# Patient Record
Sex: Female | Born: 1990 | Race: White | Hispanic: No | Smoking: Former smoker
Health system: Southern US, Community
[De-identification: ages and names within clinical notes are randomized; demographics above are authoritative.]

## PROBLEM LIST (undated history)

## (undated) DIAGNOSIS — F32A Depression, unspecified: Secondary | ICD-10-CM

## (undated) DIAGNOSIS — F419 Anxiety disorder, unspecified: Secondary | ICD-10-CM

## (undated) DIAGNOSIS — F988 Other specified behavioral and emotional disorders with onset usually occurring in childhood and adolescence: Secondary | ICD-10-CM

## (undated) HISTORY — PX: FRACTURE SURGERY: SHX138

## (undated) HISTORY — DX: Depression, unspecified: F32.A

## (undated) HISTORY — PX: TYMPANOSTOMY TUBE PLACEMENT: SHX32

## (undated) HISTORY — DX: Anxiety disorder, unspecified: F41.9

## (undated) HISTORY — PX: TONSILLECTOMY: SUR1361

## (undated) HISTORY — DX: Other specified behavioral and emotional disorders with onset usually occurring in childhood and adolescence: F98.8

---

## 2019-05-07 ENCOUNTER — Ambulatory Visit: Payer: Self-pay

## 2019-06-05 ENCOUNTER — Other Ambulatory Visit: Payer: Self-pay

## 2019-06-05 DIAGNOSIS — Z20822 Contact with and (suspected) exposure to covid-19: Secondary | ICD-10-CM

## 2019-06-06 LAB — NOVEL CORONAVIRUS, NAA: SARS-CoV-2, NAA: NOT DETECTED

## 2019-11-19 ENCOUNTER — Other Ambulatory Visit: Payer: Self-pay

## 2019-11-19 ENCOUNTER — Ambulatory Visit: Payer: Self-pay | Admitting: Physician Assistant

## 2019-11-19 DIAGNOSIS — A5901 Trichomonal vulvovaginitis: Secondary | ICD-10-CM

## 2019-11-19 DIAGNOSIS — Z113 Encounter for screening for infections with a predominantly sexual mode of transmission: Secondary | ICD-10-CM

## 2019-11-19 LAB — WET PREP FOR TRICH, YEAST, CLUE
Trichomonas Exam: POSITIVE — AB
Yeast Exam: NEGATIVE

## 2019-11-19 MED ORDER — METRONIDAZOLE 500 MG PO TABS: 2000.0000 mg | ORAL_TABLET | Freq: Once | ORAL | 0 refills | Status: AC

## 2019-11-19 NOTE — Progress Notes (Signed)
Wet mount reviewed by provider, pt treated for Trich only per provider order. Provider orders completed. °

## 2019-11-20 ENCOUNTER — Encounter: Payer: Self-pay | Admitting: Physician Assistant

## 2019-11-20 NOTE — Progress Notes (Signed)
[facility] Department STI clinic/screening visit  Subjective:  Rebecca Bates is a 29 y.o. female being seen today for an STI screening visit. The patient reports they do have symptoms.  Patient reports that they do not desire a pregnancy in the next year.   They reported they are not interested in discussing contraception today.  No LMP recorded.   Patient has the following medical conditions:  There are no problems to display for this patient.   Chief Complaint  Patient presents with  . SEXUALLY TRANSMITTED DISEASE    STD screening including bloodwork    HPI  Patient reports that she has noticed an increased amount of vaginal discharge that has been clear.  States that she has an IUD and does not have periods with it in place.  Taking meds for Anxiety, Depression and ADD.  See flowsheet for further details and programmatic requirements.    The following portions of the patient's history were reviewed and updated as appropriate: allergies, current medications, past medical history, past social history, past surgical history and problem list.  Objective:  There were no vitals filed for this visit.  Physical Exam Constitutional:      General: She is not in acute distress.    Appearance: Normal appearance. She is obese.  HENT:     Head: Normocephalic and atraumatic.     Comments: No nits, lice, or hair loss. No cervical, supraclavicular or axillary adenopathy.    Mouth/Throat:     Mouth: Mucous membranes are moist.     Pharynx: Oropharynx is clear. No oropharyngeal exudate or posterior oropharyngeal erythema.  Eyes:     Conjunctiva/sclera: Conjunctivae normal.  Pulmonary:     Effort: Pulmonary effort is normal.  Abdominal:     Palpations: Abdomen is soft. There is no mass.     Tenderness: There is no abdominal tenderness. There is no guarding or rebound.  Genitourinary:    General: Normal vulva.     Rectum: Normal.     Comments: External genitalia/pubic  area without nits, lice, edema, erythema, lesions and inguinal adenopathy. Vagina with normal mucosa and moderate amount of thin, yellowish discharge, pH=>4.5. Cervix without visible lesions , IUD strings visualized. Uterus firm, mobile, nt, no masses, no CMT, no adnexal tenderness or fullness. Musculoskeletal:     Cervical back: Neck supple. No tenderness.  Skin:    General: Skin is warm and dry.     Findings: No bruising, erythema, lesion or rash.  Neurological:     Mental Status: She is alert and oriented to person, place, and time.  Psychiatric:        Mood and Affect: Mood normal.        Behavior: Behavior normal.        Thought Content: Thought content normal.        Judgment: Judgment normal.      Assessment and Plan:  Rebecca Bates is a 29 y.o. female presenting to the [facility] Department for STI screening  1. Screening for STD (sexually transmitted disease) Patient into clinic with symptoms. Rec condoms with all sex. Await test results.  Counseled that RN will call if needs to RTC for further treatment once results are back.  - WET PREP FOR [place], YEAST, CLUE - Chlamydia/Gonorrhea [facility] - HIV [facility] - Syphilis Serology, [facility] - Gonococcus culture  2. Trichomonal vaginitis Will treat Trich with Metronidazole 2 g po with food, no EtOH for 24 hr before and  until 72 hr after taking medicine. No sex for 7 days and until after partner completes treatment. RTC for re-treatment if vomits < 2 hr after taking medicine. - metroNIDAZOLE (FLAGYL) 500 MG tablet; Take 4 tablets (2,000 mg total) by mouth once for 1 dose.  Dispense: 4 tablet; Refill: 0     No follow-ups on file.  No future appointments.  Matt Holmes, PA

## 2019-11-24 LAB — GONOCOCCUS CULTURE

## 2020-01-06 ENCOUNTER — Other Ambulatory Visit: Payer: Self-pay

## 2020-01-06 ENCOUNTER — Emergency Department (HOSPITAL_COMMUNITY): Payer: 59

## 2020-01-06 ENCOUNTER — Emergency Department (HOSPITAL_COMMUNITY)
Admission: EM | Admit: 2020-01-06 | Discharge: 2020-01-06 | Disposition: A | Discharge status: Home / Self Care | Payer: 59 | Attending: Emergency Medicine | Admitting: Emergency Medicine

## 2020-01-06 ENCOUNTER — Encounter (HOSPITAL_COMMUNITY): Payer: Self-pay | Admitting: Emergency Medicine

## 2020-01-06 DIAGNOSIS — T07XXXA Unspecified multiple injuries, initial encounter: Secondary | ICD-10-CM | POA: Diagnosis present

## 2020-01-06 DIAGNOSIS — Y999 Unspecified external cause status: Secondary | ICD-10-CM | POA: Diagnosis not present

## 2020-01-06 DIAGNOSIS — Y929 Unspecified place or not applicable: Secondary | ICD-10-CM | POA: Insufficient documentation

## 2020-01-06 DIAGNOSIS — M545 Low back pain, unspecified: Secondary | ICD-10-CM

## 2020-01-06 DIAGNOSIS — Y939 Activity, unspecified: Secondary | ICD-10-CM | POA: Diagnosis not present

## 2020-01-06 DIAGNOSIS — S62337A Displaced fracture of neck of fifth metacarpal bone, left hand, initial encounter for closed fracture: Secondary | ICD-10-CM | POA: Diagnosis not present

## 2020-01-06 DIAGNOSIS — S50811A Abrasion of right forearm, initial encounter: Secondary | ICD-10-CM | POA: Insufficient documentation

## 2020-01-06 NOTE — Progress Notes (Signed)
Orthopedic Tech Progress Note Patient Details:  Rebecca Bates 03/07/1991 [id number]  Ortho Devices Type of Ortho Device: Ulna gutter splint Ortho Device/Splint Location: LUE Ortho Device/Splint Interventions: Ordered, Application   Post Interventions Patient Tolerated: Well Instructions Provided: Care of device   Donald Pore 01/06/2020, 12:21 PM
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
## 2020-01-06 NOTE — Discharge Instructions (Signed)
May take Tylenol and ibuprofen as needed for pain.  You do have a fracture in your hand.  You were placed in a splint.  You will need to follow-up with hand surgery outpatient.  His contact information is listed in your discharge paperwork.  You will need to call to schedule an appointment.  Return to the emergency department for any new or worsening symptoms

## 2020-03-10 LAB — HIV ANTIBODY (ROUTINE TESTING W REFLEX): HIV 1&2 Ab, 4th Generation: NEGATIVE

## 2020-04-22 ENCOUNTER — Other Ambulatory Visit: Payer: Self-pay

## 2020-04-22 ENCOUNTER — Ambulatory Visit
Admission: RE | Admit: 2020-04-22 | Discharge: 2020-04-22 | Disposition: A | Discharge status: Home / Self Care | Payer: 59 | Source: Ambulatory Visit | Attending: Emergency Medicine | Admitting: Emergency Medicine

## 2020-04-22 VITALS — BP 122/62 | HR 67 | Temp 98.0°F | Resp 18 | Ht 65.0 in | Wt 280.0 lb

## 2020-04-22 DIAGNOSIS — H6692 Otitis media, unspecified, left ear: Secondary | ICD-10-CM

## 2020-04-22 DIAGNOSIS — H6121 Impacted cerumen, right ear: Secondary | ICD-10-CM

## 2020-04-22 DIAGNOSIS — H9203 Otalgia, bilateral: Secondary | ICD-10-CM

## 2020-04-22 MED ORDER — AZITHROMYCIN 250 MG PO TABS: ORAL_TABLET | ORAL | 0 refills | Status: DC

## 2020-04-22 NOTE — ED Triage Notes (Signed)
Pt c/o bilateral ear pain. Start a couple of weeks ago but the right ear worsened about a week ago. She has h/o ear problems. She has trip OTC ear drops without relief.

## 2020-04-22 NOTE — ED Provider Notes (Signed)
[facility] ____________________________________________  Time seen: Approximately 12:23 PM  I have reviewed the triage vital signs and the nursing notes.   HISTORY  Chief Complaint Appointment and Otalgia   HPI Rebecca Bates is a 29 y.o. female presenting for evaluation of bilateral otalgia.  Patient reports is been going on for the last 2 weeks but has worsened in the last 1 week.  Left worse than right.  Has tried over-the-counter swimmer's ear drops without resolution.  Denies hearing changes, drainage, bleeding or trauma.  Denies accompanying cough, congestion, chest pain or shortness of breath, sore throat or fevers.  Continues eat and drink well.  Denies other aggravating or alleviating factors.    History reviewed. No pertinent past medical history.  There are no problems to display for this patient.   Past Surgical History:  Procedure Laterality Date   CESAREAN SECTION     x 2   FRACTURE SURGERY Left    elbow   TONSILLECTOMY     TYMPANOSTOMY TUBE PLACEMENT       No current facility-administered medications for this encounter.  Current Outpatient Medications:    amphetamine-dextroamphetamine (ADDERALL) 20 MG tablet, Take by mouth., Disp: , Rfl:    buPROPion (WELLBUTRIN SR) 200 MG 12 hr tablet, Take 200 mg by mouth daily., Disp: , Rfl:    levonorgestrel (MIRENA) 20 MCG/24HR IUD, by Intrauterine route., Disp: , Rfl:    sertraline (ZOLOFT) 100 MG tablet, Take 200 mg by mouth daily., Disp: , Rfl:    azithromycin (ZITHROMAX Z-PAK) 250 MG tablet, Take 2 tablets (500 mg) on  Day 1,  followed by 1 tablet (250 mg) once daily on Days 2 through 5., Disp: 6 each, Rfl: 0  Allergies Penicillins  Family History  Problem Relation Age of Onset   Healthy Mother    Healthy Father     Social History Social History   Tobacco Use   Smoking status: Never Smoker   Smokeless tobacco: Never Used  Building services engineer Use: Never used  Substance Use  Topics   Alcohol use: Yes    Comment: occasionally   Drug use: Not Currently    Types: Marijuana    Review of Systems Constitutional: No fever ENT: No sore throat. As above.  Cardiovascular: Denies chest pain. Respiratory: Denies shortness of breath. Gastrointestinal: No abdominal pain.  No nausea, no vomiting.  No diarrhea. Musculoskeletal: Negative for back pain. Skin: Negative for rash.   ____________________________________________   PHYSICAL EXAM:  VITAL SIGNS: ED Triage Vitals  Enc Vitals Group     BP 04/22/20 1127 (!) 122/62     Pulse Rate 04/22/20 1127 67     Resp 04/22/20 1127 18     Temp 04/22/20 1127 98 F (36.7 C)     Temp Source 04/22/20 1127 Oral     SpO2 04/22/20 1127 97 %     Weight 04/22/20 1123 (!) 280 lb (127 kg)     Height 04/22/20 1123 5\' 5"  (1.651 m)     Head Circumference --      Peak Flow --      Pain Score 04/22/20 1123 6     Pain Loc --      Pain Edu? --      Excl. in GC? --     Constitutional: Alert and oriented. Well appearing and in no acute distress. Eyes: Conjunctivae are normal. Head: Atraumatic. No sinus tenderness to palpation. No swelling. No erythema.  Ears: Left: No auricle tenderness,  canal mild cerumen, moderate erythema and dull TM.  Right: Nontender, canal with total cerumen impaction, unable to fully visualize TM, post irrigation and cerumen removal, cerumen persist without ability to visualize TM. Neck: No stridor.  No cervical spine tenderness to palpation. Hematological/Lymphatic/Immunilogical: No cervical lymphadenopathy. Respiratory: Normal respiratory effort.  Musculoskeletal: Ambulatory with steady gait.  Neurologic:  Normal speech and language. No gait instability. Skin:  Skin appears warm, dry and intact. No rash noted. Psychiatric: Mood and affect are normal. Speech and behavior are normal.  ___________________________________________   LABS (all labs ordered are listed, but only abnormal results are  displayed)  Labs Reviewed - No data to display  PROCEDURES Procedures   Cerumen impaction Right cerumen impaction, nursing staff attempted to irrigate multiple times with partial removal of impaction, but unable to fully remove impaction.  Also use curette without full ability to remove.  Patient tolerated well.  INITIAL IMPRESSION / ASSESSMENT AND PLAN / ED COURSE  Pertinent labs & imaging results that were available during my care of the patient were reviewed by me and considered in my medical decision making (see chart for details).  Well-appearing patient.  No acute distress.  Otalgia complaints.  Left otitis media.  Also cerumen impaction to the right, unable to fully clear, unclear if otitis media present as well.  Will treat with oral azithromycin as penicillin allergic.  Encourage supportive care.  Discussed in 2 weeks begin over-the-counter Debrox as long as she is feeling better to right ear to remove cerumen.Discussed indication, risks and benefits of medications with patient.   Discussed follow up with Primary care physician this week. Discussed follow up and return parameters including no resolution or any worsening concerns. Patient verbalized understanding and agreed to plan.   ____________________________________________   FINAL CLINICAL IMPRESSION(S) / ED DIAGNOSES  Final diagnoses:  Left otitis media, unspecified otitis media type  Otalgia of both ears  Impacted cerumen of right ear     ED Discharge Orders         Ordered    azithromycin (ZITHROMAX Z-PAK) 250 MG tablet     Discontinue  Reprint     04/22/20 1222           Note: This dictation was prepared with Dragon dictation along with smaller phrase technology. Any transcriptional errors that result from this process are unintentional.         Renford Dills, NP 04/22/20 1621

## 2020-04-22 NOTE — Discharge Instructions (Addendum)
Take medication as prescribed. Monitor.  °Follow up with your primary care physician this week as needed. Return to Urgent care for new or worsening concerns.  ° °

## 2020-06-15 ENCOUNTER — Other Ambulatory Visit: Payer: Self-pay

## 2020-06-15 ENCOUNTER — Ambulatory Visit
Admission: RE | Admit: 2020-06-15 | Discharge: 2020-06-15 | Disposition: A | Discharge status: Home / Self Care | Payer: 59 | Source: Ambulatory Visit | Attending: Emergency Medicine | Admitting: Emergency Medicine

## 2020-06-15 VITALS — BP 106/74 | HR 65 | Temp 99.1°F | Resp 19

## 2020-06-15 DIAGNOSIS — R05 Cough: Secondary | ICD-10-CM

## 2020-06-15 DIAGNOSIS — J069 Acute upper respiratory infection, unspecified: Secondary | ICD-10-CM | POA: Diagnosis not present

## 2020-06-15 NOTE — ED Provider Notes (Signed)
[name]    CSN: [id number] Arrival date & time: 06/15/20  1048      History   Chief Complaint Chief Complaint  Patient presents with  . Cough  . Generalized Body Aches    HPI Rebecca Bates is a 29 y.o. female.   Patient presents with fatigue, body aches, congestion, nonproductive cough, and diarrhea x3 days.  One episode of diarrhea today; 2 yesterday.  Patient denies vomiting and states she is able to keep yourself hydrated at home.  Treatment attempted at home with Tylenol and Benadryl.  She denies fever, chills, sore throat, shortness of breath, vomiting, rash, or other symptoms.  The history is provided by the patient.    History reviewed. No pertinent past medical history.  There are no problems to display for this patient.   Past Surgical History:  Procedure Laterality Date  . CESAREAN SECTION     x 2  . FRACTURE SURGERY Left    elbow  . TONSILLECTOMY    . TYMPANOSTOMY TUBE PLACEMENT      OB History   No obstetric history on file.      Home Medications    Prior to Admission medications   Medication Sig Start Date End Date Taking? Authorizing Provider  amphetamine-dextroamphetamine (ADDERALL) 20 MG tablet Take by mouth.    [provider]  azithromycin (ZITHROMAX Z-PAK) 250 MG tablet Take 2 tablets (500 mg) on  Day 1,  followed by 1 tablet (250 mg) once daily on Days 2 through 5. 04/22/20   Renford Dills, NP  buPROPion [facility] SR) 200 MG 12 hr tablet Take 200 mg by mouth daily. 03/30/20   [provider]  levonorgestrel (MIRENA) 20 MCG/24HR IUD by Intrauterine route.    [provider]  sertraline (ZOLOFT) 100 MG tablet Take 200 mg by mouth daily. 03/30/20   [provider]    Family History Family History  Problem Relation Age of Onset  . Healthy Mother   . Healthy Father     Social History Social History   Tobacco Use  . Smoking status: Never Smoker  . Smokeless tobacco: Never Used  Vaping  Use  . Vaping Use: Never used  Substance Use Topics  . Alcohol use: Yes    Comment: occasionally  . Drug use: Not Currently    Types: Marijuana     Allergies   Penicillins   Review of Systems Review of Systems  Constitutional: Positive for fatigue. Negative for chills and fever.  HENT: Positive for congestion. Negative for ear pain and sore throat.   Eyes: Negative for pain and visual disturbance.  Respiratory: Positive for cough. Negative for shortness of breath.   Cardiovascular: Negative for chest pain and palpitations.  Gastrointestinal: Positive for diarrhea. Negative for abdominal pain and vomiting.  Genitourinary: Negative for dysuria and hematuria.  Musculoskeletal: Negative for arthralgias and back pain.  Skin: Negative for color change and rash.  Neurological: Negative for seizures and syncope.  All other systems reviewed and are negative.    Physical Exam Triage Vital Signs ED Triage Vitals  Enc Vitals Group     BP      Pulse      Resp      Temp      Temp src      SpO2      Weight      Height      Head Circumference      Peak Flow  Pain Score      Pain Loc      Pain Edu?      Excl. in GC?    No data found.  Updated Vital Signs BP 106/74 (BP Location: Left Arm)   Pulse 65   Temp 99.1 F (37.3 C) (Oral)   Resp 19   SpO2 95%   Visual Acuity Right Eye Distance:   Left Eye Distance:   Bilateral Distance:    Right Eye Near:   Left Eye Near:    Bilateral Near:     Physical Exam Vitals and nursing note reviewed.  Constitutional:      General: She is not in acute distress.    Appearance: She is well-developed. She is obese.  HENT:     Head: Normocephalic and atraumatic.     Right Ear: Tympanic membrane normal.     Left Ear: Tympanic membrane normal.     Nose: Nose normal.     Mouth/Throat:     Mouth: Mucous membranes are moist.     Pharynx: Oropharynx is clear.  Eyes:     Conjunctiva/sclera: Conjunctivae normal.  Cardiovascular:      Rate and Rhythm: Normal rate and regular rhythm.     Heart sounds: No murmur heard.   Pulmonary:     Effort: Pulmonary effort is normal. No respiratory distress.     Breath sounds: Normal breath sounds.  Abdominal:     Palpations: Abdomen is soft.     Tenderness: There is no abdominal tenderness. There is no guarding or rebound.  Musculoskeletal:     Cervical back: Neck supple.  Skin:    General: Skin is warm and dry.     Findings: No rash.  Neurological:     General: No focal deficit present.     Mental Status: She is alert and oriented to person, place, and time.     Gait: Gait normal.  Psychiatric:        Mood and Affect: Mood normal.        Behavior: Behavior normal.      UC Treatments / Results  Labs (all labs ordered are listed, but only abnormal results are displayed) Labs Reviewed  NOVEL CORONAVIRUS, NAA    EKG   Radiology No results found.  Procedures Procedures (including critical care time)  Medications Ordered in UC Medications - No data to display  Initial Impression / Assessment and Plan / UC Course  I have reviewed the triage vital signs and the nursing notes.  Pertinent labs & imaging results that were available during my care of the patient were reviewed by me and considered in my medical decision making (see chart for details).   Viral URI with cough.  PCR COVID pending.  Instructed patient to self quarantine until the test result is back.  Discussed symptomatic treatment including Tylenol or ibuprofen, Mucinex, rest, hydration.  Instructed patient to go to the ED if she has acute worsening symptoms.  Patient agrees to plan of care.    Final Clinical Impressions(s) / UC Diagnoses   Final diagnoses:  Viral URI with cough     Discharge Instructions     Your COVID test is pending.  You should self quarantine until the test result is back.    Take Tylenol or ibuprofen as needed for fever or discomfort; Mucinex as needed for congestion   Rest and keep yourself hydrated.    Go to the emergency department if you develop acute worsening symptoms.  ED Prescriptions    None     PDMP not reviewed this encounter.   Mickie Bail, NP 06/15/20 1133

## 2020-06-15 NOTE — Discharge Instructions (Signed)
Your COVID test is pending.  You should self quarantine until the test result is back.    Take Tylenol or ibuprofen as needed for fever or discomfort; Mucinex as needed for congestion.  Rest and keep yourself hydrated.    Go to the emergency department if you develop acute worsening symptoms.     

## 2020-06-15 NOTE — ED Triage Notes (Signed)
Pt is here with body aches and a cough that started Friday, pt has taken Tylenol and Benadryl to relieve discomfort.

## 2020-06-17 LAB — SARS-COV-2, NAA 2 DAY TAT

## 2020-06-17 LAB — NOVEL CORONAVIRUS, NAA: SARS-CoV-2, NAA: NOT DETECTED

## 2021-02-03 ENCOUNTER — Ambulatory Visit: Payer: 59 | Admitting: Dermatology

## 2021-07-08 ENCOUNTER — Other Ambulatory Visit: Payer: Self-pay

## 2021-07-08 ENCOUNTER — Emergency Department: Payer: 59

## 2021-07-08 ENCOUNTER — Emergency Department
Admission: EM | Admit: 2021-07-08 | Discharge: 2021-07-09 | Disposition: A | Discharge status: Home / Self Care | Payer: 59 | Attending: Emergency Medicine | Admitting: Emergency Medicine

## 2021-07-08 DIAGNOSIS — Z20822 Contact with and (suspected) exposure to covid-19: Secondary | ICD-10-CM | POA: Diagnosis not present

## 2021-07-08 DIAGNOSIS — L509 Urticaria, unspecified: Secondary | ICD-10-CM | POA: Insufficient documentation

## 2021-07-08 DIAGNOSIS — R911 Solitary pulmonary nodule: Secondary | ICD-10-CM | POA: Insufficient documentation

## 2021-07-08 DIAGNOSIS — R079 Chest pain, unspecified: Secondary | ICD-10-CM

## 2021-07-08 DIAGNOSIS — R0789 Other chest pain: Secondary | ICD-10-CM | POA: Diagnosis not present

## 2021-07-08 LAB — TROPONIN I (HIGH SENSITIVITY)
Troponin I (High Sensitivity): <2 ng/L (ref <18)
Troponin I (High Sensitivity): 2 ng/L (ref <18)

## 2021-07-08 LAB — BASIC METABOLIC PANEL
Anion gap: 9 (ref 5–15)
BUN: 12 mg/dL (ref 6–20)
CO2: 24 mmol/L (ref 22–32)
Calcium: 9.2 mg/dL (ref 8.9–10.3)
Chloride: 103 mmol/L (ref 98–111)
Creatinine, Ser: 0.57 mg/dL (ref 0.44–1.00)
GFR, Estimated: >60 mL/min (ref >60)
Glucose, Bld: 110 mg/dL — ABNORMAL HIGH (ref 70–99)
Potassium: 3.6 mmol/L (ref 3.5–5.1)
Sodium: 136 mmol/L (ref 135–145)

## 2021-07-08 LAB — CBC
HCT: 39.5 % (ref 36.0–46.0)
Hemoglobin: 12.8 g/dL (ref 12.0–15.0)
MCH: 28.4 pg (ref 26.0–34.0)
MCHC: 32.4 g/dL (ref 30.0–36.0)
MCV: 87.8 fL (ref 80.0–100.0)
Platelets: 233 10*3/uL (ref 150–400)
RBC: 4.5 MIL/uL (ref 3.87–5.11)
RDW: 14 % (ref 11.5–15.5)
WBC: 10.5 10*3/uL (ref 4.0–10.5)
nRBC: 0 % (ref 0.0–0.2)

## 2021-07-08 MED ORDER — KETOROLAC TROMETHAMINE 30 MG/ML IJ SOLN
30.0000 mg | Freq: Once | INTRAMUSCULAR | Status: AC
  Administered 2021-07-09: 30 mg via INTRAVENOUS
  Filled 2021-07-08: qty 1

## 2021-07-08 MED ORDER — SODIUM CHLORIDE 0.9 % IV BOLUS
500.0000 mL | Freq: Once | INTRAVENOUS | Status: AC
  Administered 2021-07-09: 500 mL via INTRAVENOUS

## 2021-07-08 MED ORDER — METHYLPREDNISOLONE SODIUM SUCC 125 MG IJ SOLR
80.0000 mg | Freq: Once | INTRAMUSCULAR | Status: AC
Start: 2021-07-08 — End: 2021-07-09
  Administered 2021-07-09: 80 mg via INTRAVENOUS
  Filled 2021-07-08: qty 2

## 2021-07-08 MED ORDER — FAMOTIDINE IN NACL 20-0.9 MG/50ML-% IV SOLN
20.0000 mg | Freq: Once | INTRAVENOUS | Status: AC
  Administered 2021-07-09: 20 mg via INTRAVENOUS
  Filled 2021-07-08: qty 50

## 2021-07-08 NOTE — ED Triage Notes (Addendum)
Pt comes with c/o CP that started this afternoon. Pt states recently being sick with cold symptoms. But states this feels different.  Pt also states rash she noticed last week.

## 2021-07-08 NOTE — ED Provider Notes (Signed)
[facility] : [facility] Emergency Department Provider Note   ____________________________________________   Event Date/Time   First MD Initiated Contact with Patient 07/08/21 2329     (approximate)  I have reviewed the triage vital signs and the nursing notes.   HISTORY  Chief Complaint Chest Pain    HPI Rebecca Bates is a 30 y.o. female who presents to the ED from home with a chief complaint of chest pain.  Patient had cold-like symptoms last week including cough, congestion, body aches and headaches.  Reports sharp chest pain onset this afternoon, nonradiating and not associated with diaphoresis, palpitations, shortness of breath, nausea/vomiting or dizziness.  Noticed itchy diffuse rash x2 weeks which seems to be getting worse.  Denies any new foods, medications including over-the-counter's, environmental exposures.  Has IUD in place.  Denies recent tick bite, travel or trauma.     Past medical history None  There are no problems to display for this patient.   Past Surgical History:  Procedure Laterality Date   CESAREAN SECTION     x 2   FRACTURE SURGERY Left    elbow   TONSILLECTOMY     TYMPANOSTOMY TUBE PLACEMENT      Prior to Admission medications   Medication Sig Start Date End Date Taking? Authorizing Provider  famotidine (PEPCID) 20 MG tablet Take 1 tablet (20 mg total) by mouth 2 (two) times daily. 07/09/21  Yes Irean Hong, MD  predniSONE (DELTASONE) 20 MG tablet 3 tablets daily x 4 days 07/09/21  Yes Irean Hong, MD  amphetamine-dextroamphetamine (ADDERALL) 20 MG tablet Take by mouth.    [provider]  azithromycin (ZITHROMAX Z-PAK) 250 MG tablet Take 2 tablets (500 mg) on  Day 1,  followed by 1 tablet (250 mg) once daily on Days 2 through 5. 04/22/20   Renford Dills, NP  buPROPion [facility] SR) 200 MG 12 hr tablet Take 200 mg by mouth daily. 03/30/20   [provider]  levonorgestrel (MIRENA) 20 MCG/24HR IUD by  Intrauterine route.    [provider]  sertraline (ZOLOFT) 100 MG tablet Take 200 mg by mouth daily. 03/30/20   [provider]    Allergies Penicillins  Family History  Problem Relation Age of Onset   Healthy Mother    Healthy Father     Social History Social History   Tobacco Use   Smoking status: Never   Smokeless tobacco: Never  Vaping Use   Vaping Use: Never used  Substance Use Topics   Alcohol use: Yes    Comment: occasionally   Drug use: Not Currently    Types: Marijuana    Review of Systems  Constitutional: No fever/chills Eyes: No visual changes. ENT: No sore throat. Cardiovascular: Positive for chest pain. Respiratory: Denies shortness of breath. Gastrointestinal: No abdominal pain.  No nausea, no vomiting.  No diarrhea.  No constipation. Genitourinary: Negative for dysuria. Musculoskeletal: Negative for back pain. Skin: Positive for rash. Neurological: Negative for headaches, focal weakness or numbness.   ____________________________________________   PHYSICAL EXAM:  VITAL SIGNS: ED Triage Vitals [07/08/21 1839]  Enc Vitals Group     BP 134/79     Pulse Rate 79     Resp 18     Temp (!) 97.5 F (36.4 C)     Temp src      SpO2 100 %     Weight      Height      Head Circumference  Peak Flow      Pain Score      Pain Loc      Pain Edu?      Excl. in GC?     Constitutional: Alert and oriented. Well appearing and in no acute distress. Eyes: Conjunctivae are normal. PERRL. EOMI. Head: Atraumatic. Nose: No congestion/rhinnorhea. Mouth/Throat: Mucous membranes are mildly dry.   Neck: No stridor.   Cardiovascular: Normal rate, regular rhythm. Grossly normal heart sounds.  Good peripheral circulation. Respiratory: Normal respiratory effort.  No retractions. Lungs CTAB. Gastrointestinal: Soft and nontender to light or deep palpation. No distention. No abdominal bruits. No CVA tenderness. Musculoskeletal: No lower  extremity tenderness nor edema.  No joint effusions. Neurologic:  Normal speech and language. No gross focal neurologic deficits are appreciated. No gait instability. Skin:  Skin is warm, dry and intact.  Diffuse urticarial rash noted.  No vesicles or petechiae. Psychiatric: Mood and affect are normal. Speech and behavior are normal.  ____________________________________________   LABS (all labs ordered are listed, but only abnormal results are displayed)  Labs Reviewed  BASIC METABOLIC PANEL - Abnormal; Notable for the following components:      Result Value   Glucose, Bld 110 (*)    All other components within normal limits  D-DIMER, QUANTITATIVE - Abnormal; Notable for the following components:   D-Dimer, Quant 0.75 (*)    All other components within normal limits  POC URINE PREG, ED - Normal  RESP PANEL BY RT-PCR (FLU A&B, COVID) ARPGX2  CBC  POC SARS CORONAVIRUS 2 AG -  ED  TROPONIN I (HIGH SENSITIVITY)  TROPONIN I (HIGH SENSITIVITY)   ____________________________________________  EKG  ED ECG REPORT I, Tailynn Armetta J, the attending physician, personally viewed and interpreted this ECG.   Date: 07/08/2021  EKG Time: 2137  Rate: 60  Rhythm: normal sinus rhythm  Axis: Normal  Intervals:none  ST&T Change: Nonspecific T wave changes No prior EKG for comparison ____________________________________________  RADIOLOGY I, Ante Arredondo J, personally viewed and evaluated these images (plain radiographs) as part of my medical decision making, as well as reviewing the written report by the radiologist.  ED MD interpretation: No acute cardiopulmonary process; no PE, pulmonary nodule  Official radiology report(s): DG Chest 2 View  Result Date: 07/08/2021 CLINICAL DATA:  Chest pain EXAM: CHEST - 2 VIEW COMPARISON:  None. FINDINGS: Normal heart size. Normal mediastinal contour. No pneumothorax. No pleural effusion. Lungs appear clear, with no acute consolidative airspace disease and  no pulmonary edema. IMPRESSION: No active cardiopulmonary disease. Electronically Signed   By: Delbert Phenix M.D.   On: 07/08/2021 19:32   CT Angio Chest PE W/Cm &/Or Wo Cm  Result Date: 07/09/2021 CLINICAL DATA:  Pulmonary embolus suspected with low to intermediate probability. Positive D-dimer. Chest pain this afternoon. EXAM: CT ANGIOGRAPHY CHEST WITH CONTRAST TECHNIQUE: Multidetector CT imaging of the chest was performed using the standard protocol during bolus administration of intravenous contrast. Multiplanar CT image reconstructions and MIPs were obtained to evaluate the vascular anatomy. CONTRAST:  OMNIPAQUE IOHEXOL 350 MG/ML SOLN COMPARISON:  Chest radiograph 07/08/2021 FINDINGS: Cardiovascular: Motion artifact limits examination. There is good opacification of the central and segmental pulmonary arteries. No focal filling defects. No evidence of significant pulmonary embolus. Normal caliber thoracic aorta. No aortic dissection. Great vessel origins are patent. Normal heart size. No pericardial effusions. Mediastinum/Nodes: Esophagus is decompressed. No significant lymphadenopathy in the chest. Thyroid gland is unremarkable. Lungs/Pleura: No airspace disease or consolidation in the lungs. Nodule in the  right upper lung posteriorly measuring 3 mm diameter. No pleural effusions. No pneumothorax. Upper Abdomen: No acute abnormalities demonstrated in the visualized upper abdomen. Musculoskeletal: No chest wall abnormality. No acute or significant osseous findings. Review of the MIP images confirms the above findings. IMPRESSION: 1. No evidence of significant pulmonary embolus. 2. No evidence of active pulmonary disease. 3. 3 mm nodule in the right upper lung. No follow-up needed if patient is low-risk. Non-contrast chest CT can be considered in 12 months if patient is high-risk. This recommendation follows the consensus statement: Guidelines for Management of Incidental Pulmonary Nodules Detected on  CT Images: From the Fleischner Society 2017; Radiology 2017; 284:228-243. Electronically Signed   By: Burman Nieves M.D.   On: 07/09/2021 02:30    ____________________________________________   PROCEDURES  Procedure(s) performed (including Critical Care):  .1-3 Lead EKG Interpretation Performed by: Irean Hong, MD Authorized by: Irean Hong, MD     Interpretation: normal     ECG rate:  60   ECG rate assessment: normal     Rhythm: sinus rhythm     Ectopy: none     Conduction: normal   Comments:     Patient placed on cardiac monitor to evaluate for arrhythmias   ____________________________________________   INITIAL IMPRESSION / ASSESSMENT AND PLAN / ED COURSE  As part of my medical decision making, I reviewed the following data within the electronic MEDICAL RECORD NUMBER Nursing notes reviewed and incorporated, Labs reviewed, EKG interpreted, Old chart reviewed, Radiograph reviewed, and Notes from prior ED visits     30 year old female presenting with chest pain after cold-like symptoms last week; also with rash. Differential diagnosis includes, but is not limited to, ACS, aortic dissection, pulmonary embolism, cardiac tamponade, pneumothorax, pneumonia, pericarditis, myocarditis, GI-related causes including esophagitis/gastritis, and musculoskeletal chest wall pain.     2 sets of troponin and EKG unremarkable.  Respiratory panel is pending.  Will check D-dimer.  Administer IV Toradol for chest pain.  Administer IV Solu-Medrol and Pepcid for rash.  Will reassess.  Clinical Course as of 07/09/21 0435  Fri Jul 09, 2021  6203 Patient feeling better.  Updated her on all test results.  Will refer her to pulmonary nodule clinic for follow-up.  Will prescribe 4 additional days of prednisone and Pepcid for rash and refer her to local allergy clinic for testing.  Strict return precautions given.  Patient verbalizes understanding and agrees with plan of care. [JS]    Clinical Course User  Index [JS] Irean Hong, MD     ____________________________________________   FINAL CLINICAL IMPRESSION(S) / ED DIAGNOSES  Final diagnoses:  Nonspecific chest pain  Urticaria  Pulmonary nodule     ED Discharge Orders          Ordered    predniSONE (DELTASONE) 20 MG tablet        07/09/21 0256    famotidine (PEPCID) 20 MG tablet  2 times daily        07/09/21 0256    AMB  Referral to Pulmonary Nodule Clinic        07/09/21 0256             Note:  This document was prepared using Dragon voice recognition software and may include unintentional dictation errors.    Irean Hong, MD 07/09/21 [phone]

## 2021-07-08 NOTE — ED Notes (Signed)
Reviewed pt's cc and results; pt taken to triage for re-assessment; no EKG noted in chart, will perform now with repeat vs and troponin

## 2021-07-09 ENCOUNTER — Encounter: Payer: Self-pay | Admitting: *Deleted

## 2021-07-09 ENCOUNTER — Emergency Department: Payer: 59

## 2021-07-09 DIAGNOSIS — R911 Solitary pulmonary nodule: Secondary | ICD-10-CM

## 2021-07-09 LAB — D-DIMER, QUANTITATIVE: D-Dimer, Quant: 0.75 ug/mL-FEU — ABNORMAL HIGH (ref 0.00–0.50)

## 2021-07-09 LAB — POC URINE PREG, ED: Preg Test, Ur: NEGATIVE

## 2021-07-09 LAB — RESP PANEL BY RT-PCR (FLU A&B, COVID) ARPGX2
Influenza A by PCR: NEGATIVE
Influenza B by PCR: NEGATIVE
SARS Coronavirus 2 by RT PCR: NEGATIVE

## 2021-07-09 MED ORDER — PREDNISONE 20 MG PO TABS: ORAL_TABLET | ORAL | 0 refills | Status: DC

## 2021-07-09 MED ORDER — FAMOTIDINE 20 MG PO TABS: 20.0000 mg | ORAL_TABLET | Freq: Two times a day (BID) | ORAL | 0 refills | Status: DC

## 2021-07-09 MED ORDER — IOHEXOL 350 MG/ML SOLN
100.0000 mL | Freq: Once | INTRAVENOUS | Status: AC | PRN
  Administered 2021-07-09: 100 mL via INTRAVENOUS

## 2021-07-09 NOTE — Discharge Instructions (Signed)
1.  Take the following medicines daily x4 days: Prednisone 60mg  daily Pepcid 20 mg twice daily 2.  You may take Benadryl as needed for itching. 3.  You have been referred to the pulmonary nodule clinic.  They will call you for an appointment. 4.  Return to the ER for worsening symptoms, persistent vomiting, difficulty breathing or other concerns.

## 2021-07-13 ENCOUNTER — Encounter: Payer: Self-pay | Admitting: Emergency Medicine

## 2021-07-13 ENCOUNTER — Ambulatory Visit
Admission: EM | Admit: 2021-07-13 | Discharge: 2021-07-13 | Disposition: A | Discharge status: Home / Self Care | Payer: 59 | Attending: Emergency Medicine | Admitting: Emergency Medicine

## 2021-07-13 DIAGNOSIS — M5441 Lumbago with sciatica, right side: Secondary | ICD-10-CM

## 2021-07-13 MED ORDER — IBUPROFEN 600 MG PO TABS: 600.0000 mg | ORAL_TABLET | Freq: Four times a day (QID) | ORAL | 0 refills | Status: AC | PRN

## 2021-07-13 MED ORDER — METHOCARBAMOL 500 MG PO TABS: 500.0000 mg | ORAL_TABLET | Freq: Two times a day (BID) | ORAL | 0 refills | Status: DC | PRN

## 2021-07-13 NOTE — ED Provider Notes (Signed)
[name]    CSN: [id number] Arrival date & time: 07/13/21  9470      History   Chief Complaint Chief Complaint  Patient presents with   Back Pain    HPI Rebecca Bates is a 30 y.o. female.  Patient presents with 2-day history of right lower back pain which radiates down her posterior right leg.  No falls or injury.  She denies numbness, weakness, saddle anesthesia, loss of bowel/bladder control, abdominal pain, dysuria, hematuria, or other symptoms.  Treatment at home with Aleve.  She reports history of sciatica during pregnancy several years ago.  The history is provided by the patient.   History reviewed. No pertinent past medical history.  There are no problems to display for this patient.   Past Surgical History:  Procedure Laterality Date   CESAREAN SECTION     x 2   FRACTURE SURGERY Left    elbow   TONSILLECTOMY     TYMPANOSTOMY TUBE PLACEMENT      OB History   No obstetric history on file.      Home Medications    Prior to Admission medications   Medication Sig Start Date End Date Taking? Authorizing Provider  ibuprofen (ADVIL) 600 MG tablet Take 1 tablet (600 mg total) by mouth every 6 (six) hours as needed. 07/13/21  Yes Mickie Bail, NP  methocarbamol (ROBAXIN) 500 MG tablet Take 1 tablet (500 mg total) by mouth 2 (two) times daily as needed for muscle spasms. 07/13/21  Yes Mickie Bail, NP  amphetamine-dextroamphetamine (ADDERALL) 20 MG tablet Take by mouth.    [provider]  azithromycin (ZITHROMAX Z-PAK) 250 MG tablet Take 2 tablets (500 mg) on  Day 1,  followed by 1 tablet (250 mg) once daily on Days 2 through 5. 04/22/20   Renford Dills, NP  buPROPion [facility] SR) 200 MG 12 hr tablet Take 200 mg by mouth daily. 03/30/20   [provider]  famotidine (PEPCID) 20 MG tablet Take 1 tablet (20 mg total) by mouth 2 (two) times daily. 07/09/21   Irean Hong, MD  levonorgestrel (MIRENA) 20 MCG/24HR IUD by Intrauterine  route.    [provider]  predniSONE (DELTASONE) 20 MG tablet 3 tablets daily x 4 days 07/09/21   Irean Hong, MD  sertraline (ZOLOFT) 100 MG tablet Take 200 mg by mouth daily. 03/30/20   [provider]    Family History Family History  Problem Relation Age of Onset   Healthy Mother    Healthy Father     Social History Social History   Tobacco Use   Smoking status: Never   Smokeless tobacco: Never  Vaping Use   Vaping Use: Never used  Substance Use Topics   Alcohol use: Yes    Comment: occasionally   Drug use: Not Currently    Types: Marijuana     Allergies   Penicillins   Review of Systems Review of Systems  Constitutional:  Negative for chills and fever.  Respiratory:  Negative for cough and shortness of breath.   Cardiovascular:  Negative for chest pain and palpitations.  Gastrointestinal:  Negative for abdominal pain and vomiting.  Genitourinary:  Negative for dysuria and hematuria.  Musculoskeletal:  Positive for back pain. Negative for arthralgias, gait problem and joint swelling.  Skin:  Negative for color change and rash.  Neurological:  Negative for weakness and numbness.  All other systems reviewed and are negative.   Physical Exam Triage Vital  Signs ED Triage Vitals  Enc Vitals Group     BP      Pulse      Resp      Temp      Temp src      SpO2      Weight      Height      Head Circumference      Peak Flow      Pain Score      Pain Loc      Pain Edu?      Excl. in GC?    No data found.  Updated Vital Signs BP 134/87 (BP Location: Left Arm)   Pulse 65   Temp 97.9 F (36.6 C)   Resp 18   SpO2 98%   Visual Acuity Right Eye Distance:   Left Eye Distance:   Bilateral Distance:    Right Eye Near:   Left Eye Near:    Bilateral Near:     Physical Exam Vitals and nursing note reviewed.  Constitutional:      General: She is not in acute distress.    Appearance: She is well-developed. She is obese. She is not  ill-appearing.  HENT:     Head: Normocephalic and atraumatic.     Mouth/Throat:     Mouth: Mucous membranes are moist.  Eyes:     Conjunctiva/sclera: Conjunctivae normal.  Cardiovascular:     Rate and Rhythm: Normal rate and regular rhythm.     Heart sounds: Normal heart sounds.  Pulmonary:     Effort: Pulmonary effort is normal. No respiratory distress.     Breath sounds: Normal breath sounds.  Abdominal:     General: Bowel sounds are normal.     Palpations: Abdomen is soft.     Tenderness: There is no abdominal tenderness. There is no right CVA tenderness, left CVA tenderness, guarding or rebound.  Musculoskeletal:        General: No swelling, tenderness, deformity or signs of injury. Normal range of motion.     Cervical back: Neck supple.       Back:  Skin:    General: Skin is warm and dry.     Capillary Refill: Capillary refill takes less than 2 seconds.     Findings: No bruising, erythema, lesion or rash.  Neurological:     General: No focal deficit present.     Mental Status: She is alert and oriented to person, place, and time.     Sensory: No sensory deficit.     Motor: No weakness.     Gait: Gait normal.  Psychiatric:        Mood and Affect: Mood normal.        Behavior: Behavior normal.     UC Treatments / Results  Labs (all labs ordered are listed, but only abnormal results are displayed) Labs Reviewed - No data to display  EKG   Radiology No results found.  Procedures Procedures (including critical care time)  Medications Ordered in UC Medications - No data to display  Initial Impression / Assessment and Plan / UC Course  I have reviewed the triage vital signs and the nursing notes.  Pertinent labs & imaging results that were available during my care of the patient were reviewed by me and considered in my medical decision making (see chart for details).  Right lower back pain with right-sided sciatica.  Treating with ibuprofen and methocarbamol.   Precautions for drowsiness with methocarbamol discussed.  Instructed patient to follow-up with her PCP or an orthopedist if her symptoms are not improving.  Education provided on sciatica.  Work note provided per patient request.  She agrees to plan of care.   Final Clinical Impressions(s) / UC Diagnoses   Final diagnoses:  Acute right-sided low back pain with right-sided sciatica     Discharge Instructions      Take ibuprofen as needed for discomfort.  Take the muscle relaxer as needed for muscle spasm; Do not drive, operate machinery, or drink alcohol with this medication as it can cause drowsiness.   Follow up with your primary care provider or an orthopedist if your symptoms are not improving.         ED Prescriptions     Medication Sig Dispense Auth. Provider   methocarbamol (ROBAXIN) 500 MG tablet Take 1 tablet (500 mg total) by mouth 2 (two) times daily as needed for muscle spasms. 10 tablet Mickie Bail, NP   ibuprofen (ADVIL) 600 MG tablet Take 1 tablet (600 mg total) by mouth every 6 (six) hours as needed. 30 tablet Mickie Bail, NP      I have reviewed the PDMP during this encounter.   Mickie Bail, NP 07/13/21 [phone]

## 2021-07-13 NOTE — Discharge Instructions (Addendum)
Take ibuprofen as needed for discomfort.  Take the muscle relaxer as needed for muscle spasm; Do not drive, operate machinery, or drink alcohol with this medication as it can cause drowsiness.   Follow up with your primary care provider or an orthopedist if your symptoms are not improving.     

## 2021-07-13 NOTE — ED Triage Notes (Signed)
Pt said hx of sciatic about 5 years with pregnancy but yesterday began having pain in her lower back that runs down her right side into her leg.

## 2021-07-17 ENCOUNTER — Emergency Department (HOSPITAL_COMMUNITY)
Admission: EM | Admit: 2021-07-17 | Discharge: 2021-07-17 | Disposition: A | Discharge status: Home / Self Care | Payer: 59 | Attending: Emergency Medicine | Admitting: Emergency Medicine

## 2021-07-17 DIAGNOSIS — M5441 Lumbago with sciatica, right side: Secondary | ICD-10-CM | POA: Diagnosis not present

## 2021-07-17 DIAGNOSIS — M545 Low back pain, unspecified: Secondary | ICD-10-CM | POA: Diagnosis present

## 2021-07-17 LAB — URINALYSIS, ROUTINE W REFLEX MICROSCOPIC
Bilirubin Urine: NEGATIVE
Glucose, UA: NEGATIVE mg/dL
Hgb urine dipstick: NEGATIVE
Ketones, ur: NEGATIVE mg/dL
Leukocytes,Ua: NEGATIVE
Nitrite: NEGATIVE
Protein, ur: NEGATIVE mg/dL
Specific Gravity, Urine: 1.038 — ABNORMAL HIGH (ref 1.005–1.030)
pH: 5 (ref 5.0–8.0)

## 2021-07-17 LAB — CBC WITH DIFFERENTIAL/PLATELET
Abs Immature Granulocytes: 0.05 10*3/uL (ref 0.00–0.07)
Basophils Absolute: 0 10*3/uL (ref 0.0–0.1)
Basophils Relative: 0 %
Eosinophils Absolute: 0 10*3/uL (ref 0.0–0.5)
Eosinophils Relative: 0 %
HCT: 45.4 % (ref 36.0–46.0)
Hemoglobin: 14 g/dL (ref 12.0–15.0)
Immature Granulocytes: 0 %
Lymphocytes Relative: 13 %
Lymphs Abs: 1.5 10*3/uL (ref 0.7–4.0)
MCH: 27.7 pg (ref 26.0–34.0)
MCHC: 30.8 g/dL (ref 30.0–36.0)
MCV: 89.9 fL (ref 80.0–100.0)
Monocytes Absolute: 0.4 10*3/uL (ref 0.1–1.0)
Monocytes Relative: 3 %
Neutro Abs: 9.6 10*3/uL — ABNORMAL HIGH (ref 1.7–7.7)
Neutrophils Relative %: 84 %
Platelets: 258 10*3/uL (ref 150–400)
RBC: 5.05 MIL/uL (ref 3.87–5.11)
RDW: 14 % (ref 11.5–15.5)
WBC: 11.6 10*3/uL — ABNORMAL HIGH (ref 4.0–10.5)
nRBC: 0 % (ref 0.0–0.2)

## 2021-07-17 LAB — BASIC METABOLIC PANEL
Anion gap: 8 (ref 5–15)
BUN: 23 mg/dL — ABNORMAL HIGH (ref 6–20)
CO2: 25 mmol/L (ref 22–32)
Calcium: 9 mg/dL (ref 8.9–10.3)
Chloride: 103 mmol/L (ref 98–111)
Creatinine, Ser: 0.69 mg/dL (ref 0.44–1.00)
GFR, Estimated: >60 mL/min (ref >60)
Glucose, Bld: 137 mg/dL — ABNORMAL HIGH (ref 70–99)
Potassium: 3.8 mmol/L (ref 3.5–5.1)
Sodium: 136 mmol/L (ref 135–145)

## 2021-07-17 MED ORDER — PREDNISONE 10 MG PO TABS: 20.0000 mg | ORAL_TABLET | Freq: Two times a day (BID) | ORAL | 0 refills | Status: DC

## 2021-07-17 MED ORDER — PREDNISONE 20 MG PO TABS
60.0000 mg | ORAL_TABLET | Freq: Once | ORAL | Status: AC
  Administered 2021-07-17: 60 mg via ORAL
  Filled 2021-07-17: qty 3

## 2021-07-17 MED ORDER — CYCLOBENZAPRINE HCL 10 MG PO TABS
10.0000 mg | ORAL_TABLET | Freq: Once | ORAL | Status: AC
  Administered 2021-07-17: 10 mg via ORAL
  Filled 2021-07-17: qty 1

## 2021-07-17 MED ORDER — CYCLOBENZAPRINE HCL 10 MG PO TABS: 10.0000 mg | ORAL_TABLET | Freq: Two times a day (BID) | ORAL | 0 refills | Status: DC | PRN

## 2021-07-17 MED ORDER — OXYCODONE-ACETAMINOPHEN 5-325 MG PO TABS
2.0000 | ORAL_TABLET | Freq: Once | ORAL | Status: AC
  Administered 2021-07-17: 2 via ORAL
  Filled 2021-07-17: qty 2

## 2021-07-17 MED ORDER — KETOROLAC TROMETHAMINE 60 MG/2ML IM SOLN
30.0000 mg | Freq: Once | INTRAMUSCULAR | Status: AC
  Administered 2021-07-17: 30 mg via INTRAMUSCULAR
  Filled 2021-07-17: qty 2

## 2021-07-17 MED ORDER — OXYCODONE-ACETAMINOPHEN 5-325 MG PO TABS: 1.0000 | ORAL_TABLET | Freq: Four times a day (QID) | ORAL | 0 refills | Status: DC | PRN
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
## 2021-07-17 NOTE — ED Provider Notes (Signed)
[facility] EMERGENCY DEPARTMENT Provider Note   CSN: [id number] Arrival date & time: 07/17/21  0818     History Chief Complaint  Patient presents with   Back Pain    Rebecca Bates is a 30 y.o. female.  HPI 30 year old female G2, P2 presents today complaining of low back pain with right leg numbness that occurred over the past 5 days.  During that time she has been seen at urgent care and at the orthopedic urgent care.  She has been on low-dose prednisone and skeletal muscle relaxants without relief.  Pain is severe at 9 out of 10.  She has no history of trauma, loss of bowel or bladder control or perineal sensation.  She does endorse that she has some numbness on the right side of the lower leg.  She has some increased pain with movement and walking.  Best position is standing.  She has no prior similar history.  She has no history of IV drug abuse or spinal manipulation.    No past medical history on file.  There are no problems to display for this patient.   Past Surgical History:  Procedure Laterality Date   CESAREAN SECTION     x 2   FRACTURE SURGERY Left    elbow   TONSILLECTOMY     TYMPANOSTOMY TUBE PLACEMENT       OB History   No obstetric history on file.     Family History  Problem Relation Age of Onset   Healthy Mother    Healthy Father     Social History   Tobacco Use   Smoking status: Never   Smokeless tobacco: Never  Vaping Use   Vaping Use: Never used  Substance Use Topics   Alcohol use: Yes    Comment: occasionally   Drug use: Not Currently    Types: Marijuana    Home Medications Prior to Admission medications   Medication Sig Start Date End Date Taking? Authorizing Provider  amphetamine-dextroamphetamine (ADDERALL) 20 MG tablet Take by mouth.    [provider]  azithromycin (ZITHROMAX Z-PAK) 250 MG tablet Take 2 tablets (500 mg) on  Day 1,  followed by 1 tablet (250 mg) once daily on Days 2 through 5.  04/22/20   Renford Dills, NP  buPROPion [facility] SR) 200 MG 12 hr tablet Take 200 mg by mouth daily. 03/30/20   [provider]  famotidine (PEPCID) 20 MG tablet Take 1 tablet (20 mg total) by mouth 2 (two) times daily. 07/09/21   Irean Hong, MD  ibuprofen (ADVIL) 600 MG tablet Take 1 tablet (600 mg total) by mouth every 6 (six) hours as needed. 07/13/21   Mickie Bail, NP  levonorgestrel (MIRENA) 20 MCG/24HR IUD by Intrauterine route.    [provider]  methocarbamol (ROBAXIN) 500 MG tablet Take 1 tablet (500 mg total) by mouth 2 (two) times daily as needed for muscle spasms. 07/13/21   Mickie Bail, NP  predniSONE (DELTASONE) 20 MG tablet 3 tablets daily x 4 days 07/09/21   Irean Hong, MD  sertraline (ZOLOFT) 100 MG tablet Take 200 mg by mouth daily. 03/30/20   [provider]    Allergies    Penicillins  Review of Systems   Review of Systems  All other systems reviewed and are negative.  Physical Exam Updated Vital Signs BP 115/75 (BP Location: Right Arm)   Pulse 77   Temp 97.6 F (36.4 C) (Oral)   Resp  20   SpO2 99%   Physical Exam Vitals and nursing note reviewed.  Constitutional:      General: She is not in acute distress.    Appearance: She is well-developed.  HENT:     Head: Normocephalic and atraumatic.     Right Ear: External ear normal.     Left Ear: External ear normal.     Nose: Nose normal.  Eyes:     Conjunctiva/sclera: Conjunctivae normal.     Pupils: Pupils are equal, round, and reactive to light.  Pulmonary:     Effort: Pulmonary effort is normal.  Musculoskeletal:        General: Normal range of motion.     Cervical back: Normal range of motion and neck supple.  Skin:    General: Skin is warm and dry.  Neurological:     General: No focal deficit present.     Mental Status: She is alert and oriented to person, place, and time.     Sensory: Sensory deficit present.     Motor: No weakness or abnormal muscle tone.      Coordination: Coordination normal.     Gait: Gait normal.     Deep Tendon Reflexes: Reflexes normal.     Comments: Decreased sensation right lower leg lateral aspect  Psychiatric:        Behavior: Behavior normal.        Thought Content: Thought content normal.    ED Results / Procedures / Treatments   Labs (all labs ordered are listed, but only abnormal results are displayed) Labs Reviewed  BASIC METABOLIC PANEL - Abnormal; Notable for the following components:      Result Value   Glucose, Bld 137 (*)    BUN 23 (*)    All other components within normal limits  URINALYSIS, ROUTINE W REFLEX MICROSCOPIC - Abnormal; Notable for the following components:   APPearance HAZY (*)    Specific Gravity, Urine 1.038 (*)    All other components within normal limits  CBC WITH DIFFERENTIAL/PLATELET - Abnormal; Notable for the following components:   WBC 11.6 (*)    Neutro Abs 9.6 (*)    All other components within normal limits  I-STAT CHEM 8, ED    EKG None  Radiology No results found.  Procedures Procedures   Medications Ordered in ED Medications  ketorolac (TORADOL) injection 30 mg (30 mg Intramuscular Given 07/17/21 1120)    ED Course  I have reviewed the triage vital signs and the nursing notes.  Pertinent labs & imaging results that were available during my care of the patient were reviewed by me and considered in my medical decision making (see chart for details).    MDM Rules/Calculators/A&P                           Patient with low back pain with radiculopathy.  She has no cauda equina symptoms or acute strength loss.  Plan referral to neurosurgery with increased dose of prednisone, Percocet, and will change skeletal muscle relaxant.  Discussed whether or not to obtain MRI at this point.  Given that she is not currently considering surgery, with no loss of strength no acute cauda equina's symptoms, will hold on that at this time.  She understands to return if she has  loss of bowel or bladder control, perineal sensation, or loss of strength we discussed return precautions and need for close follow-up and she voices understanding. Final  Clinical Impression(s) / ED Diagnoses Final diagnoses:  Acute right-sided low back pain with right-sided sciatica    Rx / DC Orders ED Discharge Orders     None        Margarita Grizzle, MD 07/17/21 1145

## 2021-07-17 NOTE — ED Triage Notes (Addendum)
Pt here d/t right side back pain. No trauma reported. Pt seen at both UC and [facility]. XRs taken. Tylenol, Robaxin and Prednisone prescribed without relief. Pt reports new onset of left foot and leg numbness. No loss of bladder control reported . 10/10 pain. Pt appears visible uncomfortable.

## 2021-07-17 NOTE — ED Notes (Signed)
Patient verbalizes understanding of d/c instructions. Opportunities for questions and answers were provided. Pt d/c from ED and ambulated to lobby with significant other. °

## 2021-07-18 LAB — I-STAT CHEM 8, ED
BUN: 26 mg/dL — ABNORMAL HIGH (ref 6–20)
Calcium, Ion: 1.22 mmol/L (ref 1.15–1.40)
Chloride: 103 mmol/L (ref 98–111)
Creatinine, Ser: 0.7 mg/dL (ref 0.44–1.00)
Glucose, Bld: 132 mg/dL — ABNORMAL HIGH (ref 70–99)
HCT: 45 % (ref 36.0–46.0)
Hemoglobin: 15.3 g/dL — ABNORMAL HIGH (ref 12.0–15.0)
Potassium: 3.8 mmol/L (ref 3.5–5.1)
Sodium: 140 mmol/L (ref 135–145)
TCO2: 27 mmol/L (ref 22–32)

## 2021-07-19 ENCOUNTER — Telehealth: Payer: Self-pay | Admitting: *Deleted

## 2021-07-19 NOTE — Telephone Encounter (Signed)
Referral received to follow up with patient for incidental lung nodule found on recent imaging. Attempted to contact pt to schedule further follow up appts based on recommendations. Pt did not answer and unable to leave message. Will mail letter to patient instructing pt to call back to schedule further follow up in the lung nodule clinic.

## 2021-07-28 ENCOUNTER — Other Ambulatory Visit: Payer: Self-pay | Admitting: Neurosurgery

## 2021-07-28 DIAGNOSIS — M5416 Radiculopathy, lumbar region: Secondary | ICD-10-CM

## 2021-08-21 ENCOUNTER — Ambulatory Visit
Admission: RE | Admit: 2021-08-21 | Discharge: 2021-08-21 | Disposition: A | Discharge status: Home / Self Care | Payer: 59 | Source: Ambulatory Visit | Attending: Neurosurgery | Admitting: Neurosurgery

## 2021-08-21 ENCOUNTER — Other Ambulatory Visit: Payer: Self-pay

## 2021-08-21 DIAGNOSIS — M5416 Radiculopathy, lumbar region: Secondary | ICD-10-CM

## 2022-06-16 ENCOUNTER — Emergency Department
Admission: EM | Admit: 2022-06-16 | Discharge: 2022-06-16 | Disposition: A | Discharge status: Home / Self Care | Payer: 59 | Attending: Emergency Medicine | Admitting: Emergency Medicine

## 2022-06-16 ENCOUNTER — Encounter: Payer: Self-pay | Admitting: Emergency Medicine

## 2022-06-16 ENCOUNTER — Other Ambulatory Visit: Payer: Self-pay

## 2022-06-16 ENCOUNTER — Emergency Department: Payer: 59

## 2022-06-16 DIAGNOSIS — W231XXA Caught, crushed, jammed, or pinched between stationary objects, initial encounter: Secondary | ICD-10-CM | POA: Diagnosis not present

## 2022-06-16 DIAGNOSIS — S6992XA Unspecified injury of left wrist, hand and finger(s), initial encounter: Secondary | ICD-10-CM | POA: Diagnosis present

## 2022-06-16 DIAGNOSIS — R202 Paresthesia of skin: Secondary | ICD-10-CM | POA: Insufficient documentation

## 2022-06-16 DIAGNOSIS — R200 Anesthesia of skin: Secondary | ICD-10-CM | POA: Insufficient documentation

## 2022-06-16 DIAGNOSIS — S60222A Contusion of left hand, initial encounter: Secondary | ICD-10-CM | POA: Insufficient documentation

## 2022-06-16 NOTE — ED Notes (Signed)
E signature pad not working. Pt educated on discharge instructions and verbalized understanding.  

## 2022-06-16 NOTE — ED Triage Notes (Signed)
Pt to ER with reports of left hand getting shut in car door.  Pt states door latched.  Minor abrasions noted to fifth digit.  Swelling noted to several fingers.  Pt reports some numbness to several fingers.

## 2022-06-16 NOTE — ED Provider Notes (Signed)
[facility] Provider Note    Event Date/Time   First MD Initiated Contact with Patient 06/16/22 1806     (approximate)   History   Chief Complaint Hand Injury   HPI  Rebecca Bates is a 31 y.o. female with no significant past medical history who presents to the ED complaining of hand injury.  Patient reports that just prior to arrival her left hand got closed in a car door.  She states that the door then latched shut and her hand was stuck for about 1 minute before they could open the door.  She states that it primarily closed on her left pinky finger, where she has noticed some pain and swelling along with difficulty bending the finger.  She reports some numbness and tingling in this finger along with her left ring finger, denies any pain in her palm area or wrist.     Physical Exam   Triage Vital Signs: ED Triage Vitals  Enc Vitals Group     BP 06/16/22 1733 135/85     Pulse Rate 06/16/22 1733 65     Resp 06/16/22 1733 18     Temp 06/16/22 1733 97.8 F (36.6 C)     Temp Source 06/16/22 1733 Oral     SpO2 06/16/22 1733 100 %     Weight 06/16/22 1744 252 lb (114.3 kg)     Height 06/16/22 1744 5\' 6"  (1.676 m)     Head Circumference --      Peak Flow --      Pain Score 06/16/22 1744 8     Pain Loc --      Pain Edu? --      Excl. in GC? --     Most recent vital signs: Vitals:   06/16/22 1733  BP: 135/85  Pulse: 65  Resp: 18  Temp: 97.8 F (36.6 C)  SpO2: 100%    Constitutional: Alert and oriented. Eyes: Conjunctivae are normal. Head: Atraumatic. Nose: No congestion/rhinnorhea. Mouth/Throat: Mucous membranes are moist.  Cardiovascular: Normal rate, regular rhythm. Grossly normal heart sounds.  2+ radial pulses bilaterally.  Cap refill less than 2 seconds throughout digits of left hand. Respiratory: Normal respiratory effort.  No retractions. Lungs CTAB. Gastrointestinal: Soft and nontender. No distention. Musculoskeletal: No lower  extremity tenderness nor edema.  Mild edema and tenderness to palpation diffusely at left pinky finger and ring finger.  Small abrasion to the ventral surface of left pinky finger with no active bleeding.  Patient able to flex and extend both fingers. Neurologic:  Normal speech and language. No gross focal neurologic deficits are appreciated.    ED Results / Procedures / Treatments   Labs (all labs ordered are listed, but only abnormal results are displayed) Labs Reviewed - No data to display  RADIOLOGY Left hand x-ray reviewed and interpreted by me with no fracture or dislocation.  PROCEDURES:  Critical Care performed: No  Procedures   MEDICATIONS ORDERED IN ED: Medications - No data to display   IMPRESSION / MDM / ASSESSMENT AND PLAN / ED COURSE  I reviewed the triage vital signs and the nursing notes.                              31 y.o. female with no significant past medical history who presents to the ED complaining of left hand pain and swelling after her pinky and ring fingers were closed in a  car door.  Patient's presentation is most consistent with acute complicated illness / injury requiring diagnostic workup.  Differential diagnosis includes, but is not limited to, fracture, dislocation, ligamentous injury, vascular injury.  Patient nontoxic-appearing and in no acute distress, vital signs are unremarkable.  She is neurovascularly intact throughout her left hand, able to flex and extend pinky and ring fingers with minimal difficulty.  No evidence to suggest fracture or dislocation on x-ray, no findings concerning for significant ligamentous injury.  Pinky finger was splinted and buddy taped to ring finger using Ace wrap.  Patient is appropriate for discharge home with PCP follow-up, was counseled to return to the ED for new or worsening symptoms, patient agrees with plan.      FINAL CLINICAL IMPRESSION(S) / ED DIAGNOSES   Final diagnoses:  Contusion of left  hand, initial encounter     Rx / DC Orders   ED Discharge Orders     None        Note:  This document was prepared using Dragon voice recognition software and may include unintentional dictation errors.   Blake Divine, MD 06/16/22 1919

## 2022-09-28 DIAGNOSIS — R5383 Other fatigue: Secondary | ICD-10-CM | POA: Diagnosis not present

## 2022-09-28 DIAGNOSIS — Z730 Burn-out: Secondary | ICD-10-CM | POA: Diagnosis not present

## 2022-09-28 DIAGNOSIS — G4700 Insomnia, unspecified: Secondary | ICD-10-CM | POA: Diagnosis not present

## 2022-09-28 DIAGNOSIS — F900 Attention-deficit hyperactivity disorder, predominantly inattentive type: Secondary | ICD-10-CM | POA: Diagnosis not present

## 2022-09-28 DIAGNOSIS — R69 Illness, unspecified: Secondary | ICD-10-CM | POA: Diagnosis not present

## 2022-09-28 DIAGNOSIS — F411 Generalized anxiety disorder: Secondary | ICD-10-CM | POA: Diagnosis not present

## 2022-09-29 IMAGING — MR MR LUMBAR SPINE W/O CM
4 of 9 series · 15 of 48 positions shown · non-contrast
Comparison: Radiograph from 01/06/2020.

CLINICAL DATA: Initial evaluation for low back pain with extension
into the right buttock and right lower extremity.

EXAM:
MRI LUMBAR SPINE WITHOUT CONTRAST
TECHNIQUE: Multiplanar, multisequence MR imaging of the lumbar spine was
performed. No intravenous contrast was administered.

[Series 5: T2 · sagittal · 4.0mm · 0.73mm/px · 4 of 15 slices shown (1 of 4)]
[im 1/15]
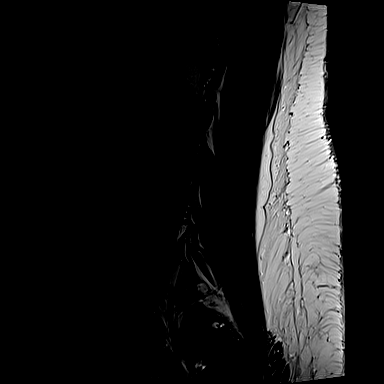
[im 5/15]
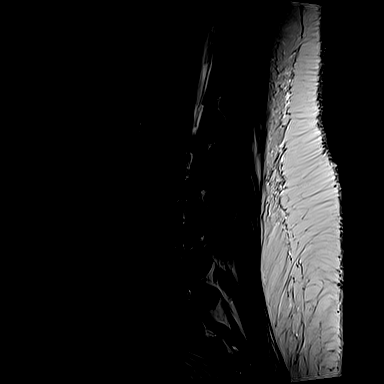
[im 10/15]
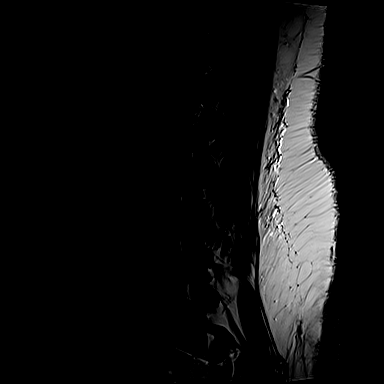
[im 15/15]
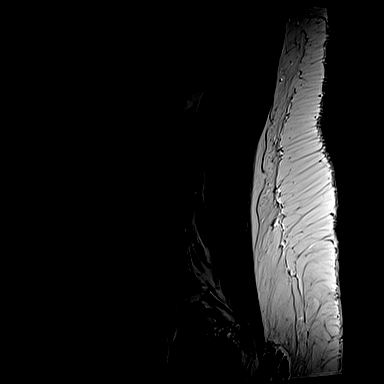

[Series 11: T2 · axial · 4.0mm · 0.28mm/px · z∈[+18,+118]mm · 5 of 21 slices shown (2 of 4)]
[im 1/21]
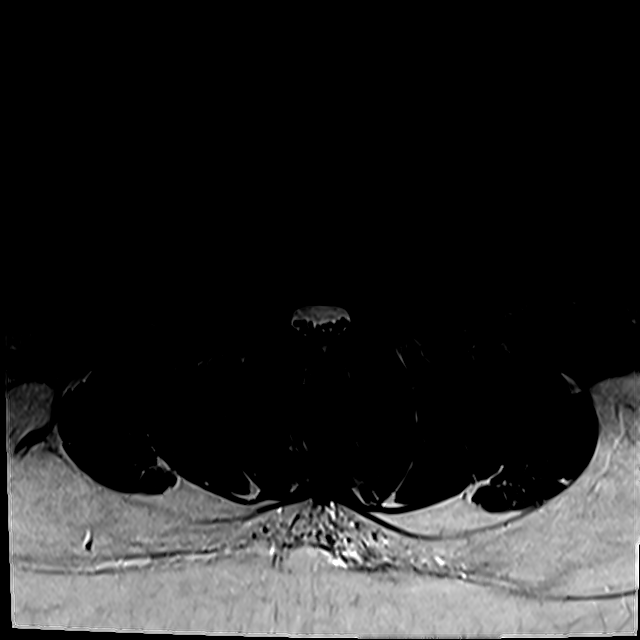
[im 6/21]
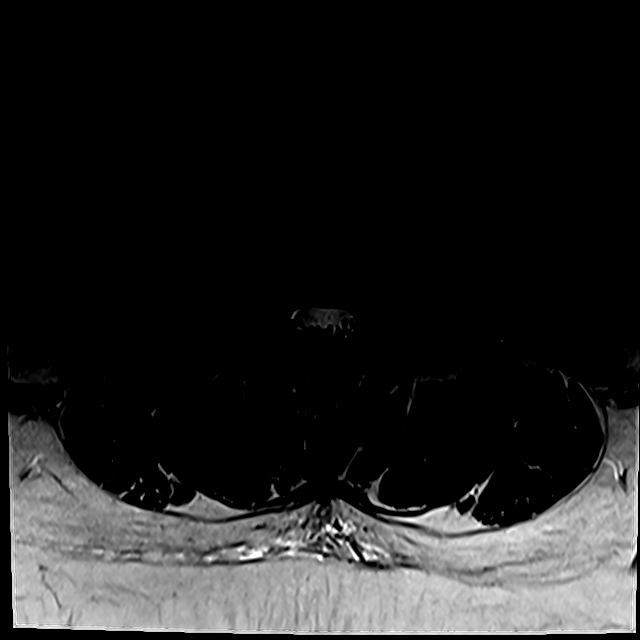
[im 11/21]
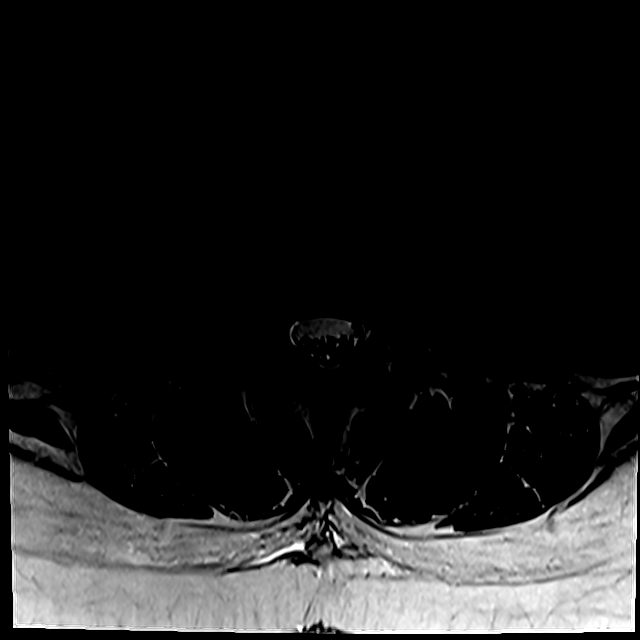
[im 16/21]
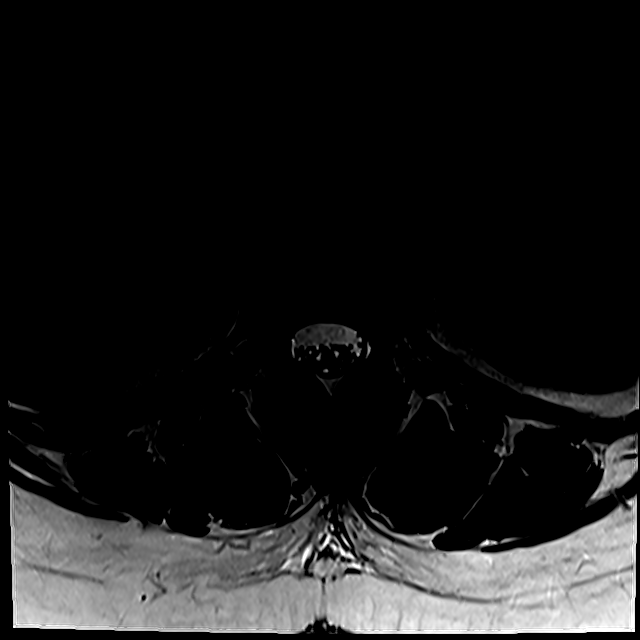
[im 21/21]
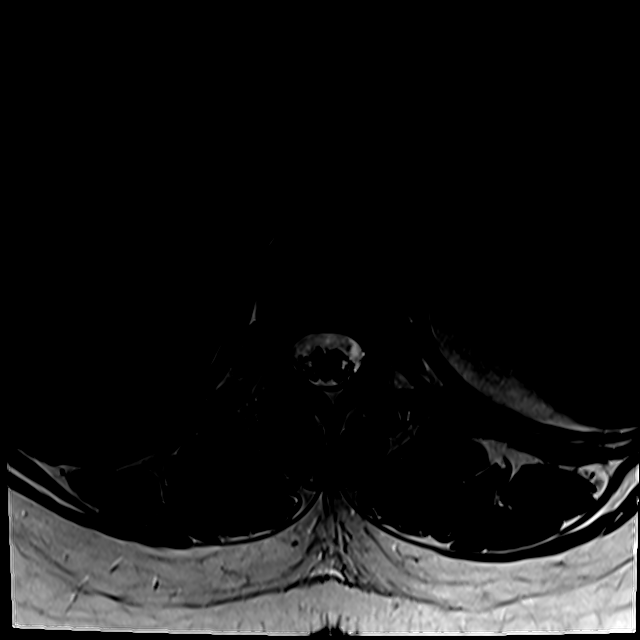

[Series 12: T2 · axial · 4.0mm · 0.28mm/px · z∈[-97,-3]mm · 3 of 20 slices shown (3 of 4)]
[im 1/20]
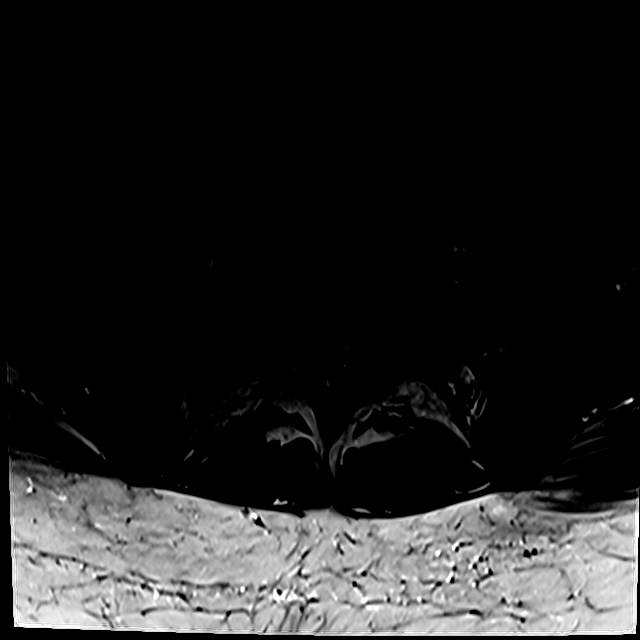
[im 10/20]
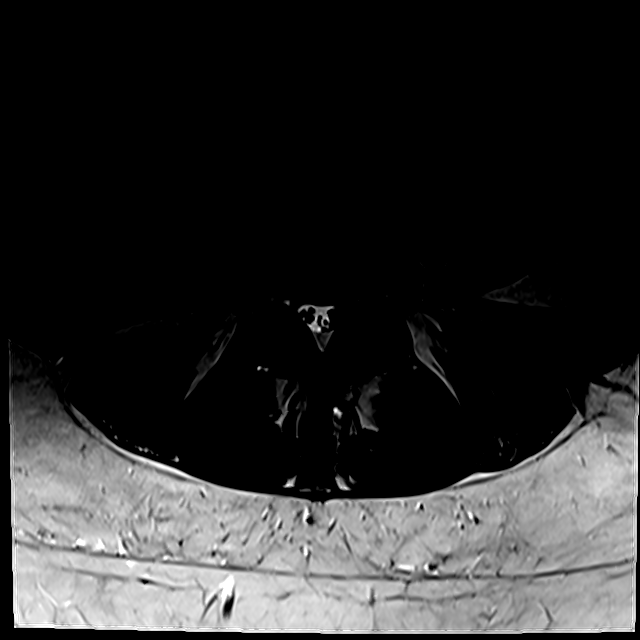
[im 20/20]
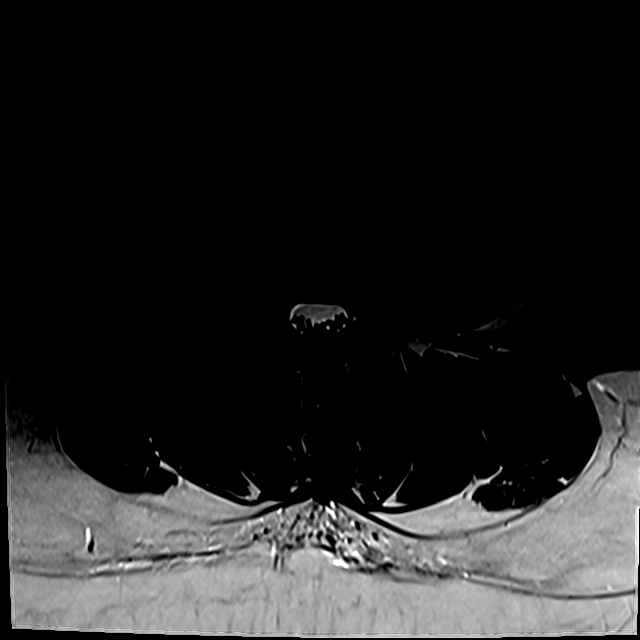

[Series 13: T2 · axial · 4.0mm · 0.28mm/px · z∈[-72,+93]mm · 3 of 41 slices shown (4 of 4)]
[im 6/41]
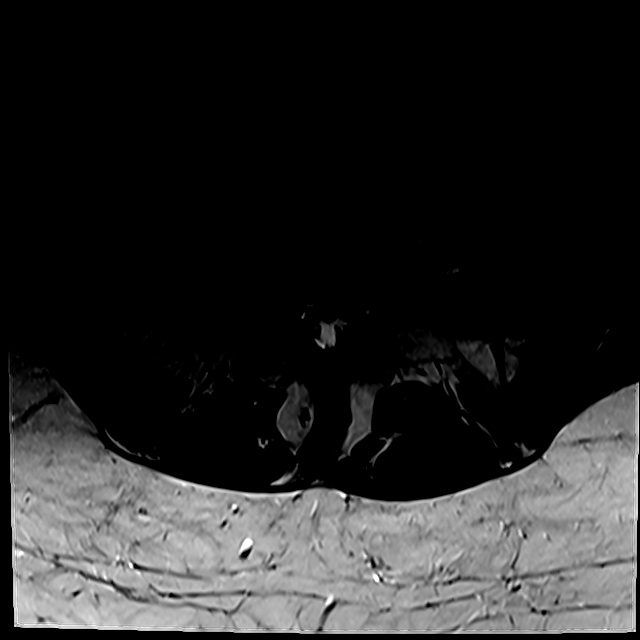
[im 21/41]
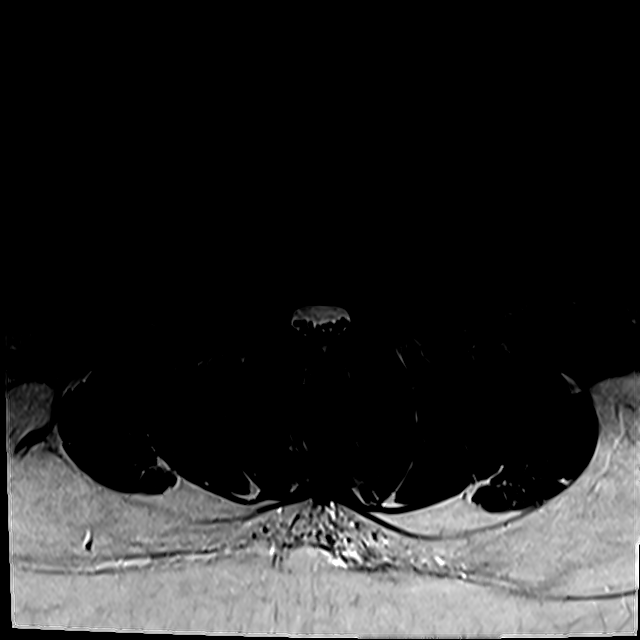
[im 36/41]
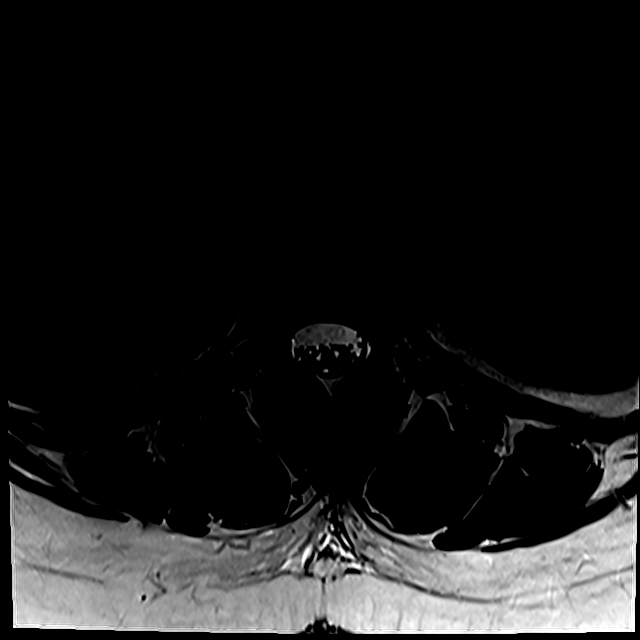

[15 of 48 positions shown; findings below may reference images not displayed]

FINDINGS: Segmentation: Standard. Lowest well-formed disc space labeled the
L5-S1 level.

Alignment: Mild dextroscoliosis. Alignment otherwise normal
preservation of the normal lumbar lordosis. No listhesis.

Vertebrae: Vertebral body height maintained without acute or chronic
fracture. Bone marrow signal intensity within normal limits. No
worrisome osseous lesions. No abnormal marrow edema.

Conus medullaris and cauda equina: Conus extends to the T12 level.
Conus and cauda equina appear normal.

Paraspinal and other soft tissues: Unremarkable.

Disc levels:

L1-2: Normal interspace. Mild bilateral facet hypertrophy. No canal
or foraminal stenosis.

L2-3: Normal interspace. Mild bilateral facet hypertrophy. No canal
or foraminal stenosis.

L3-4: Normal interspace. Mild bilateral facet hypertrophy. No canal
or foraminal stenosis.

L4-5: Small central disc extrusion with slight inferior migration
indents the ventral thecal sac (series 13, image 34). Mild to
moderate bilateral facet hypertrophy. Resultant mild narrowing of
the lateral recesses bilaterally. Central canal remains patent. No
significant foraminal encroachment.

L5-S1: Mild degenerative intervertebral disc space narrowing. Disc
desiccation with mild disc bulge and mild reactive endplate
spurring. Superimposed broad-based central disc protrusion with
slight inferior migration. Protruding disc contacts both of the
descending S1 nerve roots as they course through the lateral
recesses (series 10, image 39). Superimposed mild facet hypertrophy.
Resultant moderate bilateral lateral recess stenosis. Central canal
remains patent. No significant foraminal encroachment.
IMPRESSION: 1. Broad based central disc protrusion at L5-S1, contacting and
potentially irritating either of the descending S1 nerve roots as
they course through the lateral recesses.
2. Small central disc extrusion at L4-5 with resultant mild
bilateral lateral recess stenosis.
3. Mild-to-moderate bilateral facet hypertrophy throughout the
lumbar spine, which could contribute to underlying back pain.

## 2022-11-17 DIAGNOSIS — R059 Cough, unspecified: Secondary | ICD-10-CM | POA: Diagnosis not present

## 2022-11-17 DIAGNOSIS — J209 Acute bronchitis, unspecified: Secondary | ICD-10-CM | POA: Diagnosis not present

## 2022-11-17 DIAGNOSIS — Z20822 Contact with and (suspected) exposure to covid-19: Secondary | ICD-10-CM | POA: Diagnosis not present

## 2022-12-28 DIAGNOSIS — F3342 Major depressive disorder, recurrent, in full remission: Secondary | ICD-10-CM | POA: Diagnosis not present

## 2022-12-28 DIAGNOSIS — F411 Generalized anxiety disorder: Secondary | ICD-10-CM | POA: Diagnosis not present

## 2022-12-28 DIAGNOSIS — F900 Attention-deficit hyperactivity disorder, predominantly inattentive type: Secondary | ICD-10-CM | POA: Diagnosis not present

## 2022-12-28 DIAGNOSIS — Z730 Burn-out: Secondary | ICD-10-CM | POA: Diagnosis not present

## 2022-12-28 DIAGNOSIS — G4700 Insomnia, unspecified: Secondary | ICD-10-CM | POA: Diagnosis not present

## 2022-12-28 DIAGNOSIS — R5383 Other fatigue: Secondary | ICD-10-CM | POA: Diagnosis not present

## 2023-03-27 DIAGNOSIS — Z730 Burn-out: Secondary | ICD-10-CM | POA: Diagnosis not present

## 2023-03-27 DIAGNOSIS — F900 Attention-deficit hyperactivity disorder, predominantly inattentive type: Secondary | ICD-10-CM | POA: Diagnosis not present

## 2023-03-27 DIAGNOSIS — F411 Generalized anxiety disorder: Secondary | ICD-10-CM | POA: Diagnosis not present

## 2023-03-27 DIAGNOSIS — F3342 Major depressive disorder, recurrent, in full remission: Secondary | ICD-10-CM | POA: Diagnosis not present

## 2023-05-10 DIAGNOSIS — M9901 Segmental and somatic dysfunction of cervical region: Secondary | ICD-10-CM | POA: Diagnosis not present

## 2023-05-10 DIAGNOSIS — M9906 Segmental and somatic dysfunction of lower extremity: Secondary | ICD-10-CM | POA: Diagnosis not present

## 2023-05-10 DIAGNOSIS — M531 Cervicobrachial syndrome: Secondary | ICD-10-CM | POA: Diagnosis not present

## 2023-05-10 DIAGNOSIS — M542 Cervicalgia: Secondary | ICD-10-CM | POA: Diagnosis not present

## 2023-05-10 DIAGNOSIS — G44219 Episodic tension-type headache, not intractable: Secondary | ICD-10-CM | POA: Diagnosis not present

## 2023-05-10 DIAGNOSIS — M9902 Segmental and somatic dysfunction of thoracic region: Secondary | ICD-10-CM | POA: Diagnosis not present

## 2023-05-10 DIAGNOSIS — M5032 Other cervical disc degeneration, mid-cervical region, unspecified level: Secondary | ICD-10-CM | POA: Diagnosis not present

## 2023-05-10 DIAGNOSIS — M722 Plantar fascial fibromatosis: Secondary | ICD-10-CM | POA: Diagnosis not present

## 2023-05-10 DIAGNOSIS — M9903 Segmental and somatic dysfunction of lumbar region: Secondary | ICD-10-CM | POA: Diagnosis not present

## 2023-05-10 DIAGNOSIS — M9905 Segmental and somatic dysfunction of pelvic region: Secondary | ICD-10-CM | POA: Diagnosis not present

## 2023-05-11 DIAGNOSIS — M9906 Segmental and somatic dysfunction of lower extremity: Secondary | ICD-10-CM | POA: Diagnosis not present

## 2023-05-11 DIAGNOSIS — M9905 Segmental and somatic dysfunction of pelvic region: Secondary | ICD-10-CM | POA: Diagnosis not present

## 2023-05-11 DIAGNOSIS — M722 Plantar fascial fibromatosis: Secondary | ICD-10-CM | POA: Diagnosis not present

## 2023-05-11 DIAGNOSIS — G44219 Episodic tension-type headache, not intractable: Secondary | ICD-10-CM | POA: Diagnosis not present

## 2023-05-11 DIAGNOSIS — M9901 Segmental and somatic dysfunction of cervical region: Secondary | ICD-10-CM | POA: Diagnosis not present

## 2023-05-11 DIAGNOSIS — M9903 Segmental and somatic dysfunction of lumbar region: Secondary | ICD-10-CM | POA: Diagnosis not present

## 2023-05-11 DIAGNOSIS — M9902 Segmental and somatic dysfunction of thoracic region: Secondary | ICD-10-CM | POA: Diagnosis not present

## 2023-05-11 DIAGNOSIS — M5032 Other cervical disc degeneration, mid-cervical region, unspecified level: Secondary | ICD-10-CM | POA: Diagnosis not present

## 2023-05-11 DIAGNOSIS — M531 Cervicobrachial syndrome: Secondary | ICD-10-CM | POA: Diagnosis not present

## 2023-05-11 DIAGNOSIS — M542 Cervicalgia: Secondary | ICD-10-CM | POA: Diagnosis not present

## 2023-05-16 DIAGNOSIS — M9901 Segmental and somatic dysfunction of cervical region: Secondary | ICD-10-CM | POA: Diagnosis not present

## 2023-05-16 DIAGNOSIS — M5032 Other cervical disc degeneration, mid-cervical region, unspecified level: Secondary | ICD-10-CM | POA: Diagnosis not present

## 2023-05-16 DIAGNOSIS — M9905 Segmental and somatic dysfunction of pelvic region: Secondary | ICD-10-CM | POA: Diagnosis not present

## 2023-05-16 DIAGNOSIS — G44219 Episodic tension-type headache, not intractable: Secondary | ICD-10-CM | POA: Diagnosis not present

## 2023-05-16 DIAGNOSIS — M722 Plantar fascial fibromatosis: Secondary | ICD-10-CM | POA: Diagnosis not present

## 2023-05-16 DIAGNOSIS — M542 Cervicalgia: Secondary | ICD-10-CM | POA: Diagnosis not present

## 2023-05-16 DIAGNOSIS — M9903 Segmental and somatic dysfunction of lumbar region: Secondary | ICD-10-CM | POA: Diagnosis not present

## 2023-05-16 DIAGNOSIS — M9902 Segmental and somatic dysfunction of thoracic region: Secondary | ICD-10-CM | POA: Diagnosis not present

## 2023-05-16 DIAGNOSIS — M531 Cervicobrachial syndrome: Secondary | ICD-10-CM | POA: Diagnosis not present

## 2023-05-16 DIAGNOSIS — M9906 Segmental and somatic dysfunction of lower extremity: Secondary | ICD-10-CM | POA: Diagnosis not present

## 2023-05-18 DIAGNOSIS — M542 Cervicalgia: Secondary | ICD-10-CM | POA: Diagnosis not present

## 2023-05-18 DIAGNOSIS — M722 Plantar fascial fibromatosis: Secondary | ICD-10-CM | POA: Diagnosis not present

## 2023-05-18 DIAGNOSIS — M5032 Other cervical disc degeneration, mid-cervical region, unspecified level: Secondary | ICD-10-CM | POA: Diagnosis not present

## 2023-05-18 DIAGNOSIS — M531 Cervicobrachial syndrome: Secondary | ICD-10-CM | POA: Diagnosis not present

## 2023-05-18 DIAGNOSIS — M9905 Segmental and somatic dysfunction of pelvic region: Secondary | ICD-10-CM | POA: Diagnosis not present

## 2023-05-18 DIAGNOSIS — M9901 Segmental and somatic dysfunction of cervical region: Secondary | ICD-10-CM | POA: Diagnosis not present

## 2023-05-18 DIAGNOSIS — M9902 Segmental and somatic dysfunction of thoracic region: Secondary | ICD-10-CM | POA: Diagnosis not present

## 2023-05-18 DIAGNOSIS — G44219 Episodic tension-type headache, not intractable: Secondary | ICD-10-CM | POA: Diagnosis not present

## 2023-05-18 DIAGNOSIS — M9906 Segmental and somatic dysfunction of lower extremity: Secondary | ICD-10-CM | POA: Diagnosis not present

## 2023-05-18 DIAGNOSIS — M9903 Segmental and somatic dysfunction of lumbar region: Secondary | ICD-10-CM | POA: Diagnosis not present

## 2023-05-23 DIAGNOSIS — M9901 Segmental and somatic dysfunction of cervical region: Secondary | ICD-10-CM | POA: Diagnosis not present

## 2023-05-23 DIAGNOSIS — M531 Cervicobrachial syndrome: Secondary | ICD-10-CM | POA: Diagnosis not present

## 2023-05-23 DIAGNOSIS — G44219 Episodic tension-type headache, not intractable: Secondary | ICD-10-CM | POA: Diagnosis not present

## 2023-05-23 DIAGNOSIS — M9905 Segmental and somatic dysfunction of pelvic region: Secondary | ICD-10-CM | POA: Diagnosis not present

## 2023-05-23 DIAGNOSIS — M5032 Other cervical disc degeneration, mid-cervical region, unspecified level: Secondary | ICD-10-CM | POA: Diagnosis not present

## 2023-05-23 DIAGNOSIS — M9903 Segmental and somatic dysfunction of lumbar region: Secondary | ICD-10-CM | POA: Diagnosis not present

## 2023-05-23 DIAGNOSIS — M9906 Segmental and somatic dysfunction of lower extremity: Secondary | ICD-10-CM | POA: Diagnosis not present

## 2023-05-23 DIAGNOSIS — M722 Plantar fascial fibromatosis: Secondary | ICD-10-CM | POA: Diagnosis not present

## 2023-05-23 DIAGNOSIS — M9902 Segmental and somatic dysfunction of thoracic region: Secondary | ICD-10-CM | POA: Diagnosis not present

## 2023-05-23 DIAGNOSIS — M542 Cervicalgia: Secondary | ICD-10-CM | POA: Diagnosis not present

## 2024-01-05 ENCOUNTER — Ambulatory Visit (INDEPENDENT_AMBULATORY_CARE_PROVIDER_SITE_OTHER): Admitting: Internal Medicine

## 2024-01-05 ENCOUNTER — Other Ambulatory Visit: Payer: Self-pay

## 2024-01-05 ENCOUNTER — Encounter: Payer: Self-pay | Admitting: Internal Medicine

## 2024-01-05 VITALS — BP 120/72 | HR 68 | Temp 97.7°F | Resp 16 | Ht 65.0 in | Wt 296.3 lb

## 2024-01-05 DIAGNOSIS — E66813 Obesity, class 3: Secondary | ICD-10-CM

## 2024-01-05 DIAGNOSIS — Z1159 Encounter for screening for other viral diseases: Secondary | ICD-10-CM | POA: Diagnosis not present

## 2024-01-05 DIAGNOSIS — R011 Cardiac murmur, unspecified: Secondary | ICD-10-CM

## 2024-01-05 DIAGNOSIS — R7303 Prediabetes: Secondary | ICD-10-CM

## 2024-01-05 DIAGNOSIS — Z13220 Encounter for screening for lipoid disorders: Secondary | ICD-10-CM

## 2024-01-05 DIAGNOSIS — Z30431 Encounter for routine checking of intrauterine contraceptive device: Secondary | ICD-10-CM

## 2024-01-05 DIAGNOSIS — Z6841 Body Mass Index (BMI) 40.0 and over, adult: Secondary | ICD-10-CM

## 2024-01-05 NOTE — Progress Notes (Addendum)
 New Patient Office Visit  Subjective    Patient ID: Rebecca Bates, female    DOB: [birth year]  Age: 33 y.o. MRN: [id number]  CC:  Chief Complaint  Patient presents with   Establish Care    HPI Rebecca Bates presents to establish care.  Obesity: -Struggled with weight for years, had disordered eating habits in high school -Gained weight after pregnancies, was able to lose 40 pounds with intermittent fasting but since she was taken off all her medications last fall she gained the weight back and continues to gain -Also started gaining after she stopped vaping  -Interested in discussing weight loss medications today  MDD/Anxiety/ADHD: -Following with Psychiatry  -Mood status: stable -Current treatment: Zoloft 25 mg, had been on Adderall 30 mg BID about 8-10 years for ADHD but had to stop taking the Adderall back in the fall due to insurance issues -Side effects: no Medication compliance: excellent compliance     01/05/2024   10:10 AM  Depression screen PHQ 2/9  Decreased Interest 1  Down, Depressed, Hopeless 1  PHQ - 2 Score 2  Altered sleeping 1  Tired, decreased energy 1  Change in appetite 1  Feeling bad or failure about yourself  1  Trouble concentrating 1  Moving slowly or fidgety/restless 1  Suicidal thoughts 0  PHQ-9 Score 8  Difficult doing work/chores Not difficult at all   Contraception: -Currently had a Mirena IUD placed 8 years ago  -No longer following with Gynecology   Health Maintenance: -Blood work due -Pap: due   Outpatient Encounter Medications as of 01/05/2024  Medication Sig   ibuprofen (ADVIL) 600 MG tablet Take 1 tablet (600 mg total) by mouth every 6 (six) hours as needed.   sertraline (ZOLOFT) 100 MG tablet Take 200 mg by mouth daily.   amphetamine-dextroamphetamine (ADDERALL) 20 MG tablet Take by mouth. (Patient not taking: Reported on 01/05/2024)   buPROPion (WELLBUTRIN SR) 200 MG 12 hr tablet Take 200 mg by mouth daily. (Patient not  taking: Reported on 01/05/2024)   famotidine (PEPCID) 20 MG tablet Take 1 tablet (20 mg total) by mouth 2 (two) times daily. (Patient not taking: Reported on 01/05/2024)   levonorgestrel (MIRENA) 20 MCG/24HR IUD by Intrauterine route. (Patient not taking: Reported on 01/05/2024)   No facility-administered encounter medications on file as of 01/05/2024.    Past Medical History:  Diagnosis Date   ADD (attention deficit disorder)    Anxiety    Depression     Past Surgical History:  Procedure Laterality Date   CESAREAN SECTION     x 2   FRACTURE SURGERY Left    elbow   TONSILLECTOMY     TYMPANOSTOMY TUBE PLACEMENT      Family History  Problem Relation Age of Onset   Depression Mother    Anxiety disorder Mother    Cancer Mother    Healthy Mother    Hypertension Father    Heart disease Father    Healthy Father     Social History   Socioeconomic History   Marital status: Legally Separated    Spouse name: Not on file   Number of children: 2   Years of education: Not on file   Highest education level: Bachelor's degree (e.g., BA, AB, BS)  Occupational History   Not on file  Tobacco Use   Smoking status: Never   Smokeless tobacco: Never  Vaping Use   Vaping status: Former  Substance and Sexual Activity   Alcohol use: Not  Currently    Comment: occasionally   Drug use: Not Currently    Types: Marijuana   Sexual activity: Yes    Partners: Male    Birth control/protection: I.U.D.  Other Topics Concern   Not on file  Social History Narrative   Not on file   Social Drivers of Health   Financial Resource Strain: Medium Risk (01/04/2024)   Overall Financial Resource Strain (CARDIA)    Difficulty of Paying Living Expenses: Somewhat hard  Food Insecurity: Food Insecurity Present (01/04/2024)   Hunger Vital Sign    Worried About Running Out of Food in the Last Year: Sometimes true    Ran Out of Food in the Last Year: Never true  Transportation Needs: No Transportation  Needs (01/04/2024)   PRAPARE - Administrator, Civil Service (Medical): No    Lack of Transportation (Non-Medical): No  Physical Activity: Unknown (01/04/2024)   Exercise Vital Sign    Days of Exercise per Week: 0 days    Minutes of Exercise per Session: Not on file  Stress: Stress Concern Present (01/04/2024)   [employer] of Occupational Health - Occupational Stress Questionnaire    Feeling of Stress : To some extent  Social Connections: Socially Isolated (01/04/2024)   Social Connection and Isolation Panel [NHANES]    Frequency of Communication with Friends and Family: Once a week    Frequency of Social Gatherings with Friends and Family: Never    Attends Religious Services: Never    Database administrator or Organizations: No    Attends Engineer, structural: Not on file    Marital Status: Divorced  Intimate Partner Violence: Not on file    Review of Systems  All other systems reviewed and are negative.       Objective    Pulse 68   Temp 97.7 F (36.5 C) (Oral)   Resp 16   Ht 5\' 5"  (1.651 m)   Wt 296 lb 4.8 oz (134.4 kg)   SpO2 97%   BMI 49.31 kg/m   Physical Exam Constitutional:      Appearance: Normal appearance. She is obese.  HENT:     Head: Normocephalic and atraumatic.     Mouth/Throat:     Mouth: Mucous membranes are moist.     Pharynx: Oropharynx is clear.  Eyes:     Extraocular Movements: Extraocular movements intact.     Conjunctiva/sclera: Conjunctivae normal.     Pupils: Pupils are equal, round, and reactive to light.  Neck:     Comments: No thyromegaly  Cardiovascular:     Rate and Rhythm: Normal rate and regular rhythm.     Heart sounds: Murmur heard.     Comments: Systolic murmur ausculted  Pulmonary:     Effort: Pulmonary effort is normal.     Breath sounds: Normal breath sounds.  Musculoskeletal:     Cervical back: No tenderness.  Lymphadenopathy:     Cervical: No cervical adenopathy.  Skin:    General: Skin  is warm and dry.  Neurological:     General: No focal deficit present.     Mental Status: She is alert. Mental status is at baseline.  Psychiatric:        Mood and Affect: Mood normal.        Behavior: Behavior normal.         Assessment & Plan:   Assessment and Plan Assessment & Plan Depression and Anxiety Long-standing depression and anxiety managed with Zoloft.  Current dose insufficient with minimal improvement. - Continue Zoloft 25 mg until psychiatry appointment. - Discuss increasing Zoloft dosage with psychiatrist.  Attention-Deficit/Hyperactivity Disorder (ADHD) ADHD previously managed with Adderall, discontinued due to insurance issues. Current prescription pending prior authorization. - Discuss ADHD management with psychiatrist.  Obesity Significant weight gain possibly related to medication changes and lifestyle factors. Interested in GLP-1 receptor agonists for weight loss. - Order labs including A1c, thyroid function, and lipid panel. - Discuss potential use of GLP-1 receptor agonists (Zepbound or Wegovy) for weight loss. Discussed all potential side effects of medication. No personal or family history of medullary thyroid carcinoma. - Advise on lifestyle modifications including diet and exercise. - Schedule follow-up three weeks after starting weight loss medication if prescribed.  Heart Murmur Newly identified heart murmur requires further evaluation to rule out pathological causes. - Refer to cardiology for evaluation and echocardiogram.  Contraceptive Management IUD placed eight years ago, potentially nearing end of effective lifespan. No current gynecologist for follow-up. - Refer to gynecology for IUD evaluation and potential replacement. - Advise use of additional contraception methods until IUD is evaluated.  General Health Maintenance Up to date on vaccinations. Family history of breast cancer. Discussed future cancer screenings. - Plan mammogram  screenings starting at age 76. - Plan colon cancer screenings starting at age 40.  Follow-up Plans for follow-up contingent on outcomes of current evaluations and treatments. - Return for fasting labs. - Follow up in three weeks if weight loss medication is started. - Annual physical if no new medications are prescribed.  - CBC w/Diff/Platelet - COMPLETE METABOLIC PANEL WITHOUT GFR - HgB A1c - TSH - Ambulatory referral to Cardiology - Lipid Profile - Hepatitis C Antibody - Ambulatory referral to Gynecology   Return in about 1 year (around 01/04/2025).   Margarita Mail, DO

## 2024-01-05 NOTE — Patient Instructions (Addendum)
 It was great seeing you today!  Plan discussed at today's visit: -Blood work ordered today, results will be uploaded to MyChart. Please return for fasting labs, we are open every weekday from 8-11:30 and 1:30- 3:30 -Can call insurance to find out insurance coverage for weight loss - please ask about Zepbound or Wegovy. Please call to schedule appointment 3 week after starting medication. -Referral to Gynecology placed today -Referral to Cardiology for potential ultrasound of heart   Follow up in: 1 year or sooner as needed  Take care and let us know if you have any questions or concerns prior to your next visit.  Dr. Caralee Ates

## 2024-01-06 ENCOUNTER — Other Ambulatory Visit: Payer: Self-pay | Admitting: Medical Genetics

## 2024-01-12 LAB — COMPLETE METABOLIC PANEL WITHOUT GFR
AG Ratio: 1.5 (calc) (ref 1.0–2.5)
ALT: 22 U/L (ref 6–29)
AST: 19 U/L (ref 10–30)
Albumin: 4 g/dL (ref 3.6–5.1)
Alkaline phosphatase (APISO): 63 U/L (ref 31–125)
BUN: 14 mg/dL (ref 7–25)
CO2: 27 mmol/L (ref 20–32)
Calcium: 8.5 mg/dL — ABNORMAL LOW (ref 8.6–10.2)
Chloride: 105 mmol/L (ref 98–110)
Creat: 0.73 mg/dL (ref 0.50–0.97)
Globulin: 2.7 g/dL (ref 1.9–3.7)
Glucose, Bld: 151 mg/dL — ABNORMAL HIGH (ref 65–99)
Potassium: 4 mmol/L (ref 3.5–5.3)
Sodium: 139 mmol/L (ref 135–146)
Total Bilirubin: 0.4 mg/dL (ref 0.2–1.2)
Total Protein: 6.7 g/dL (ref 6.1–8.1)

## 2024-01-12 LAB — LIPID PANEL
Cholesterol: 178 mg/dL (ref <200)
HDL: 47 mg/dL — ABNORMAL LOW (ref >=50)
LDL Cholesterol (Calc): 106 mg/dL — ABNORMAL HIGH
Non-HDL Cholesterol (Calc): 131 mg/dL — ABNORMAL HIGH (ref <130)
Total CHOL/HDL Ratio: 3.8 (calc) (ref <5.0)
Triglycerides: 136 mg/dL (ref <150)

## 2024-01-12 LAB — CBC WITH DIFFERENTIAL/PLATELET
Absolute Lymphocytes: 2195 {cells}/uL (ref 850–3900)
Absolute Monocytes: 348 {cells}/uL (ref 200–950)
Basophils Absolute: 41 {cells}/uL (ref 0–200)
Basophils Relative: 0.7 %
Eosinophils Absolute: 360 {cells}/uL (ref 15–500)
Eosinophils Relative: 6.1 %
HCT: 40.2 % (ref 35.0–45.0)
Hemoglobin: 12.8 g/dL (ref 11.7–15.5)
MCH: 27.4 pg (ref 27.0–33.0)
MCHC: 31.8 g/dL — ABNORMAL LOW (ref 32.0–36.0)
MCV: 86.1 fL (ref 80.0–100.0)
MPV: 9.3 fL (ref 7.5–12.5)
Monocytes Relative: 5.9 %
Neutro Abs: 2956 {cells}/uL (ref 1500–7800)
Neutrophils Relative %: 50.1 %
Platelets: 184 10*3/uL (ref 140–400)
RBC: 4.67 10*6/uL (ref 3.80–5.10)
RDW: 13.7 % (ref 11.0–15.0)
Total Lymphocyte: 37.2 %
WBC: 5.9 10*3/uL (ref 3.8–10.8)

## 2024-01-12 LAB — HEMOGLOBIN A1C
Hgb A1c MFr Bld: 5.7 % — ABNORMAL HIGH (ref <5.7)
Mean Plasma Glucose: 117 mg/dL
eAG (mmol/L): 6.5 mmol/L

## 2024-01-12 LAB — TSH: TSH: 1.85 m[IU]/L

## 2024-01-12 LAB — HEPATITIS C ANTIBODY: Hepatitis C Ab: NONREACTIVE

## 2024-01-15 ENCOUNTER — Other Ambulatory Visit: Payer: Self-pay | Admitting: Internal Medicine

## 2024-01-15 DIAGNOSIS — E66813 Obesity, class 3: Secondary | ICD-10-CM

## 2024-01-15 DIAGNOSIS — R7303 Prediabetes: Secondary | ICD-10-CM

## 2024-01-15 MED ORDER — TIRZEPATIDE-WEIGHT MANAGEMENT 2.5 MG/0.5ML ~~LOC~~ SOAJ: 2.5000 mg | SUBCUTANEOUS | 0 refills | Status: DC

## 2024-01-15 NOTE — Addendum Note (Signed)
 Addended by: Rockney Cid on: 01/15/2024 01:11 PM   Modules accepted: Orders

## 2024-01-16 NOTE — Telephone Encounter (Signed)
 Requested medication (s) are due for refill today: No  Requested medication (s) are on the active medication list: Yes  Last refill:  01/15/24  Future visit scheduled: No  Notes to clinic:  Pharmacy comment: Alternative Requested      Requested Prescriptions  Pending Prescriptions Disp Refills   ZEPBOUND  2.5 MG/0.5ML Pen [Pharmacy Med Name: ZEPBOUND  2.5 MG/0.5 ML PEN]  0    Sig: INJECT 2.5 MG SUBCUTANEOUSLY WEEKLY     Off-Protocol Failed - 01/16/2024 12:59 PM      Failed - Medication not assigned to a protocol, review manually.      Passed - Valid encounter within last 12 months    Recent Outpatient Visits           1 week ago Class 3 severe obesity with body mass index (BMI) of 45.0 to 49.9 in adult, unspecified obesity type, unspecified whether serious comorbidity present [facility])   [facility] Rockney Cid, DO       Future Appointments             [place], Veryl Gottron, MD [facility]

## 2024-01-17 ENCOUNTER — Encounter: Payer: Self-pay | Admitting: Internal Medicine

## 2024-01-17 ENCOUNTER — Ambulatory Visit: Attending: Internal Medicine | Admitting: Internal Medicine

## 2024-01-17 VITALS — BP 120/70 | HR 61 | Ht 65.0 in | Wt 295.8 lb

## 2024-01-17 DIAGNOSIS — R0609 Other forms of dyspnea: Secondary | ICD-10-CM | POA: Diagnosis not present

## 2024-01-17 DIAGNOSIS — R079 Chest pain, unspecified: Secondary | ICD-10-CM | POA: Diagnosis not present

## 2024-01-17 DIAGNOSIS — R011 Cardiac murmur, unspecified: Secondary | ICD-10-CM | POA: Diagnosis not present

## 2024-01-17 NOTE — Progress Notes (Signed)
 Cardiology Office Note:  .   Date:  01/17/2024  ID:  Rebecca Bates, DOB Nov 23, 1990, MRN [id number] PCP: Rockney Cid, DO   HeartCare Providers Cardiologist:  New Click to update primary MD,subspecialty MD or APP then REFRESH:1}    History of Present Illness: .   Rebecca Bates is a 33 y.o. female with history of ADD, anxiety, depression, and morbid obesity, who has been referred for evaluation of heart murmur.  Rebecca Bates reports that she was seen by her new PCP, Dr. Bud Care, a week ago.  Heart murmur was identified at that time, which is new for her.  She had not seen a physician regularly for about 3 years.  She reports having put on 40 pounds over the last year since she stopped vaping.  Over this time, she has experienced increasing dyspnea on exertion, including with light activities such as going up stairs.  She also has some sharp pains in the chest at times, which sometimes worsen with exertion.  She also feels like her heart races when she exerts herself.  She attributes some of her chest discomfort and palpitations to anxiety as well.  She also has lower extremity edema, most pronounced at the Rebecca Bates of the day.  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  Rebecca Bates reports having undergone a stress echocardiogram in [place] 8 years ago for evaluation of swelling.  It appears that a stress echocardiogram was performed at that time and was unremarkable.  She was placed on a diuretic but has not taken one in a long time.  She notes that she felt better a couple of years ago after having lost 50 pounds.  She was recently prescribed Zepbound  by Dr. Bud Care to help facilitate weight loss but has not yet started this medication.  ROS: See HPI  Studies Reviewed: Aaron Aas   EKG Interpretation Date/Time:  Wednesday January 17 2024 09:15:06 EDT Ventricular Rate:  61 PR Interval:  156 QRS Duration:  90 QT Interval:  460 QTC Calculation: 463 R Axis:   10  Text  Interpretation: Normal sinus rhythm Minimal voltage criteria for LVH, may be normal variant ( R in aVL ) Inverted T waves have replaced nonspecific T wave abnormality in Abnormal ECG When compared with ECG of 08-Jul-2021 21:37, Inferior T wave changes are less pronounced Confirmed by Lawrie Tunks [id number]) on 01/17/2024 9:36:53 AM    Risk Assessment/Calculations:             Physical Exam:   VS:  BP 120/70   Pulse 61   Ht 5\' 5"  (1.651 m)   Wt 295 lb 12.8 oz (134.2 kg)   SpO2 98%   BMI 49.22 kg/m    Wt Readings from Last 3 Encounters:  01/17/24 295 lb 12.8 oz (134.2 kg)  01/05/24 296 lb 4.8 oz (134.4 kg)  06/16/22 252 lb (114.3 kg)    General:  NAD. Neck: No JVD or HJR, though body habitus limits evaluation. Lungs: Clear to auscultation bilaterally without wheezes or crackles. Heart: Regular rate and rhythm with 2/6 holosystolic murmur.  No rubs or gallops. Abdomen: Soft, nontender, nondistended. Extremities: Trace nonpitting pretibial edema bilaterally.  ASSESSMENT AND PLAN: .    Dyspnea on exertion and heart murmur: Rebecca Bates reports progressive dyspnea on exertion over the last year that has accompanied her 40 pound weight gain.  She also has some dependent edema in her legs.  Given these findings as well as her systolic murmur  on exam today, we have agreed to obtain an echocardiogram for further evaluation.  I will defer adding any medications at this time.  I encouraged her to use compression stockings to help with her dependent leg swelling.  I also encouraged her to begin using Zepbound  to aid in weight loss, as she felt much better before her weight gain.  Chest pain: Rebecca Bates notes sharp pain in the chest that occasionally accompanies her exertional dyspnea, though can also occur sporadically when at rest.  She underwent exercise stress echocardiogram in 2016, which was normal.  Her young age and paucity of cardiovascular risk factors make ASCVD less likely.  EKG today shows  sinus rhythm and nonspecific T wave changes in the inferior leads, less pronounced than prior tracing in 2022.  We will begin with an echocardiogram, if she continues to have discomfort, it may be worthwhile to consider either an exercise tolerance test (if tolerated) or stress MRI (to avoid ionizing radiation exposure) in the future.  Morbid obesity: BMI approaching 50.  Rebecca Bates was recently prescribed Zepbound , which she has not yet started.  I encouraged her to move forward with this and to increase her activity as tolerated in an effort to lose weight and improve her stamina.    Dispo: Return to clinic in 4 to 6 weeks.  Signed, Sammy Crisp, MD

## 2024-01-17 NOTE — Patient Instructions (Addendum)
 Medication Instructions:  Your Physician recommend you continue on your current medication as directed.    *If you need a refill on your cardiac medications before your next appointment, please call your pharmacy*  Lab Work: None If you have labs (blood work) drawn today and your tests are completely normal, you will receive your results only by: MyChart Message (if you have MyChart) OR A paper copy in the mail If you have any lab test that is abnormal or we need to change your treatment, we will call you to review the results.  Testing/Procedures: Echo Your physician has requested that you have an echocardiogram. Echocardiography is a painless test that uses sound waves to create images of your heart. It provides your doctor with information about the size and shape of your heart and how well your heart's chambers and valves are working.   You may receive an ultrasound enhancing agent through an IV if needed to better visualize your heart during the echo. This procedure takes approximately one hour.  There are no restrictions for this procedure.  This will take place at [address] [facility] [address] [facility], [place] [address]  Please note: We ask at that you not bring children with you during ultrasound (echo/ vascular) testing. Due to room size and safety concerns, children are not allowed in the ultrasound rooms during exams. Our front office staff cannot provide observation of children in our lobby area while testing is being conducted. An adult accompanying a patient to their appointment will only be allowed in the ultrasound room at the discretion of the ultrasound technician under special circumstances. We apologize for any inconvenience.   Follow-Up: At [facility], you and your health needs are our priority.  As part of our continuing mission to provide you with exceptional heart care, our providers are all part of one team.  This team includes your primary  Cardiologist (physician) and Advanced Practice Providers or APPs (Physician Assistants and Nurse Practitioners) who all work together to provide you with the care you need, when you need it.  Your next appointment:   4 - 6 week(s)  Provider:   You may see Sammy Crisp, MD or one of the following Advanced Practice Providers on your designated Care Team:   Laneta Pintos, NP Gildardo Labrador, PA-C Varney Gentleman, PA-C Cadence Millbrook, PA-C Ronald Cockayne, NP Morey Ar, NP

## 2024-02-01 ENCOUNTER — Ambulatory Visit: Payer: 59 | Admitting: Internal Medicine

## 2024-02-07 ENCOUNTER — Encounter: Admitting: Obstetrics and Gynecology

## 2024-02-07 DIAGNOSIS — Z7689 Persons encountering health services in other specified circumstances: Secondary | ICD-10-CM

## 2024-02-27 ENCOUNTER — Ambulatory Visit: Attending: Internal Medicine

## 2024-02-27 ENCOUNTER — Ambulatory Visit: Payer: Self-pay | Admitting: Internal Medicine

## 2024-02-27 DIAGNOSIS — R011 Cardiac murmur, unspecified: Secondary | ICD-10-CM | POA: Diagnosis not present

## 2024-02-27 DIAGNOSIS — R0609 Other forms of dyspnea: Secondary | ICD-10-CM

## 2024-02-27 LAB — ECHOCARDIOGRAM COMPLETE
AR max vel: 2.39 cm2
AV Area VTI: 2.27 cm2
AV Area mean vel: 2.22 cm2
AV Mean grad: 7 mmHg
AV Peak grad: 12.3 mmHg
Ao pk vel: 1.75 m/s
Area-P 1/2: 3.65 cm2
S' Lateral: 3.13 cm

## 2024-03-05 ENCOUNTER — Encounter: Payer: Self-pay | Admitting: Certified Nurse Midwife

## 2024-03-05 ENCOUNTER — Ambulatory Visit: Admitting: Certified Nurse Midwife

## 2024-03-05 VITALS — BP 111/72 | HR 55 | Ht 66.0 in | Wt 285.9 lb

## 2024-03-05 DIAGNOSIS — Z30433 Encounter for removal and reinsertion of intrauterine contraceptive device: Secondary | ICD-10-CM | POA: Diagnosis not present

## 2024-03-05 MED ORDER — LEVONORGESTREL 20 MCG/DAY IU IUD
1.0000 | INTRAUTERINE_SYSTEM | Freq: Once | INTRAUTERINE | Status: AC
Start: 2024-03-05 — End: 2024-03-05
  Administered 2024-03-05: 1 via INTRAUTERINE

## 2024-03-05 NOTE — Progress Notes (Signed)
    GYNECOLOGY CLINIC PROCEDURE NOTE  Ms. Rebecca Bates is a 33 y.o. No obstetric history on file. here for Mirena IUD removal & replacement. No GYN concerns.  Last pap smear was 2-3 years ago and was normal.  BP 111/72   Pulse (!) 55   Ht 5\' 6"  (1.676 m) Comment: patient reports  Wt 285 lb 14.4 oz (129.7 kg)   LMP  (Within Months)   BMI 46.15 kg/m   IUD Removal  Patient identified, informed consent performed, consent signed.   Discussed risks of irregular bleeding, cramping, infection, malpositioning or misplacement of the IUD outside the uterus which may require further procedure such as laparoscopy, risk of failure <1%. Time out was performed.  Patient was in the dorsal lithotomy position, normal external genitalia was noted.  A speculum was placed in the patient's vagina, normal discharge was noted, no lesions. The cervix was visualized, no lesions, no abnormal discharge.  The strings of the IUD were grasped and pulled using ring forceps. The IUD was removed in its entirety.   IUD Insertion Procedure Note  Cleansed with Betadine x 3.  Grasped anteriorly with Allis.  Uterus sounded to 6.5 cm.   IUD placed per manufacturer's recommendations.  Strings trimmed to 3 cm. Allis removed, good hemostasis noted.  Patient tolerated procedure well.    ASSESSMENT:  Encounter for IUD removal and reinsertion - Plan: levonorgestrel (MIRENA) 20 MCG/DAY IUD 1 each   Meds ordered this encounter  Medications   levonorgestrel (MIRENA) 20 MCG/DAY IUD 1 each     Plan:  Patient was given post-procedure instructions.     Call if you are having increasing pain, cramps or bleeding or if you have a fever greater than 100.4 degrees F., shaking chills, nausea or vomiting. Patient was also asked to check IUD strings periodically and follow up in 4 weeks for IUD check or sooner with problems.  No follow-ups on file.  Forestine Igo, CNM 03/05/2024 9:11 AM

## 2024-03-06 ENCOUNTER — Ambulatory Visit: Admitting: Cardiology

## 2024-07-24 ENCOUNTER — Other Ambulatory Visit: Payer: Self-pay | Admitting: Medical Genetics

## 2024-07-24 DIAGNOSIS — Z006 Encounter for examination for normal comparison and control in clinical research program: Secondary | ICD-10-CM

## 2025-01-07 ENCOUNTER — Ambulatory Visit: Admitting: Internal Medicine
